# Patient Record
Sex: Female | Born: 1980 | Race: Black or African American | Hispanic: No | Marital: Married | Smoking: Current every day smoker
Health system: Southern US, Community
[De-identification: ages and names within clinical notes are randomized; demographics above are authoritative.]

---

## 2003-07-02 ENCOUNTER — Encounter (INDEPENDENT_AMBULATORY_CARE_PROVIDER_SITE_OTHER): Payer: Self-pay | Admitting: Specialist

## 2003-07-02 ENCOUNTER — Inpatient Hospital Stay (HOSPITAL_COMMUNITY): Admission: AD | Admit: 2003-07-02 | Discharge: 2003-08-01 | Payer: Self-pay | Admitting: Obstetrics & Gynecology

## 2003-09-03 ENCOUNTER — Ambulatory Visit (HOSPITAL_COMMUNITY): Admission: RE | Admit: 2003-09-03 | Discharge: 2003-09-03 | Payer: Self-pay | Admitting: Obstetrics and Gynecology

## 2003-10-03 ENCOUNTER — Encounter (INDEPENDENT_AMBULATORY_CARE_PROVIDER_SITE_OTHER): Payer: Self-pay | Admitting: Specialist

## 2003-10-03 ENCOUNTER — Inpatient Hospital Stay (HOSPITAL_COMMUNITY): Admission: AD | Admit: 2003-10-03 | Discharge: 2003-10-06 | Payer: Self-pay | Admitting: Obstetrics

## 2004-04-26 ENCOUNTER — Ambulatory Visit (HOSPITAL_COMMUNITY): Admission: RE | Admit: 2004-04-26 | Discharge: 2004-04-26 | Payer: Self-pay | Admitting: Obstetrics & Gynecology

## 2004-04-27 ENCOUNTER — Emergency Department (HOSPITAL_COMMUNITY): Admission: EM | Admit: 2004-04-27 | Discharge: 2004-04-27 | Payer: Self-pay | Admitting: *Deleted

## 2004-04-28 ENCOUNTER — Emergency Department (HOSPITAL_COMMUNITY): Admission: EM | Admit: 2004-04-28 | Discharge: 2004-04-28 | Payer: Self-pay | Admitting: Emergency Medicine

## 2004-06-07 ENCOUNTER — Ambulatory Visit (HOSPITAL_COMMUNITY): Admission: RE | Admit: 2004-06-07 | Discharge: 2004-06-07 | Payer: Self-pay | Admitting: Obstetrics

## 2004-07-25 ENCOUNTER — Inpatient Hospital Stay (HOSPITAL_COMMUNITY): Admission: AD | Admit: 2004-07-25 | Discharge: 2004-07-30 | Payer: Self-pay | Admitting: Obstetrics

## 2004-08-12 ENCOUNTER — Ambulatory Visit (HOSPITAL_COMMUNITY): Admission: RE | Admit: 2004-08-12 | Discharge: 2004-08-12 | Payer: Self-pay | Admitting: Obstetrics & Gynecology

## 2004-09-02 ENCOUNTER — Ambulatory Visit (HOSPITAL_COMMUNITY): Admission: RE | Admit: 2004-09-02 | Discharge: 2004-09-02 | Payer: Self-pay | Admitting: Obstetrics & Gynecology

## 2004-09-03 ENCOUNTER — Observation Stay (HOSPITAL_COMMUNITY): Admission: AD | Admit: 2004-09-03 | Discharge: 2004-09-04 | Payer: Self-pay | Admitting: Obstetrics & Gynecology

## 2004-09-13 ENCOUNTER — Inpatient Hospital Stay (HOSPITAL_COMMUNITY): Admission: AD | Admit: 2004-09-13 | Discharge: 2004-09-19 | Payer: Self-pay | Admitting: Obstetrics

## 2004-09-16 ENCOUNTER — Encounter (INDEPENDENT_AMBULATORY_CARE_PROVIDER_SITE_OTHER): Payer: Self-pay | Admitting: Specialist

## 2004-11-12 ENCOUNTER — Emergency Department (HOSPITAL_COMMUNITY): Admission: EM | Admit: 2004-11-12 | Discharge: 2004-11-12 | Payer: Self-pay | Admitting: Emergency Medicine

## 2005-03-13 ENCOUNTER — Emergency Department (HOSPITAL_COMMUNITY): Admission: EM | Admit: 2005-03-13 | Discharge: 2005-03-14 | Payer: Self-pay | Admitting: Emergency Medicine

## 2006-01-16 ENCOUNTER — Emergency Department (HOSPITAL_COMMUNITY): Admission: EM | Admit: 2006-01-16 | Discharge: 2006-01-16 | Payer: Self-pay | Admitting: Emergency Medicine

## 2006-05-03 ENCOUNTER — Other Ambulatory Visit (HOSPITAL_COMMUNITY): Admission: RE | Admit: 2006-05-03 | Discharge: 2006-08-01 | Payer: Self-pay | Admitting: Psychiatry

## 2006-05-05 ENCOUNTER — Ambulatory Visit: Payer: Self-pay | Admitting: Psychiatry

## 2007-02-16 IMAGING — US US OB COMP +14 WK
1 series · 13 of 28 positions shown · non-contrast
Comparison: none

<!--  IDXRADR:ADDEND:BEGIN -->Addendum Begins<!--  IDXRADR:ADDEND:INNER_BEGIN -->Retrospective review of this exam suggests the presence of an asymmetric complete placenta previa.

 <!--  IDXRADR:ADDEND:INNER_END -->Addendum Ends
<!--  IDXRADR:ADDEND:END -->Clinical Data:  G4 P2 SAB1.  LMP 02/05/04.  Assess fetal anatomy.
 OBSTETRICAL ULTRASOUND:
 Number of Fetuses:  1
 Heart Rate:  160
 Movement:  Yes
 Breathing:  No  
 Presentation:  Transverse with head on maternal right
 Placental Location:  Anterior, right lateral 
 Grade:  I
 Previa:  No
 Amniotic Fluid (Subjective):  Normal
 Amniotic Fluid (Objective):   3.4 cm Vertical pocket 
 FETAL BIOMETRY
 BPD:   4.0 cm   18 w 1 d
 HC:   15.4 cm   18 w 3 d
 AC:   14.0 cm   19 w 3 d
 FL:    3.0 cm   19 w 1 d
 MEAN GA:  18 w 6 d
 1st US GA:  19 w 0 d (Assigned)    
 FETAL ANATOMY
 Lateral Ventricles:    Visualized 
 Thalami/CSP:      Visualized 
 Posterior Fossa:  Visualized 
 Nuchal Region:    Visualized 
 Spine:      Visualized 
 4 Chamber Heart on Left:      Visualized 
 Stomach on Left:      Visualized 
 3 Vessel Cord:    Visualized 
 Cord Insertion site:    Visualized 
 Kidneys:  Visualized 
 Bladder:  Visualized 
 Extremities:      Visualized 
 ADDITIONAL ANATOMY VISUALIZED:  LVOT, RVOT, upper lip, orbits, profile, diaphragm, 5th digit, ductal arch, aortic arch, and female genitalia.
 MATERNAL UTERINE AND ADNEXAL FINDINGS
 Cervix:   4.1 cm Transabdominally

[Series 1: us ob comp +14 wk · 0.22mm/px · 13 of 76 slices shown]
[im 3/76]
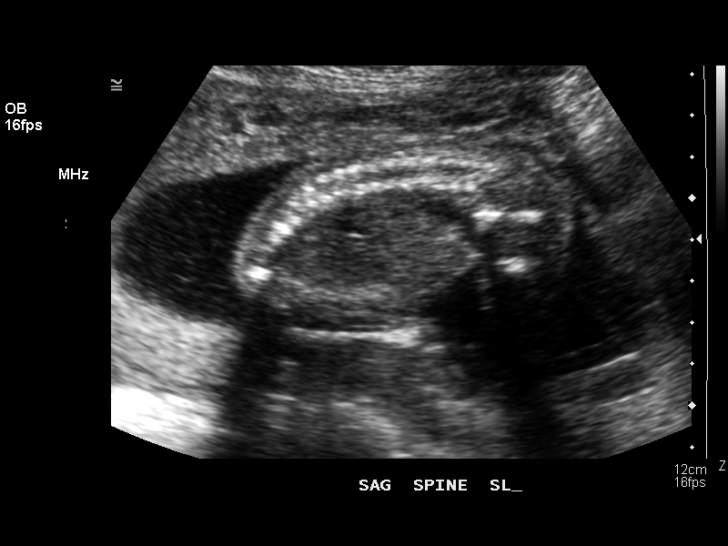
[im 9/76]
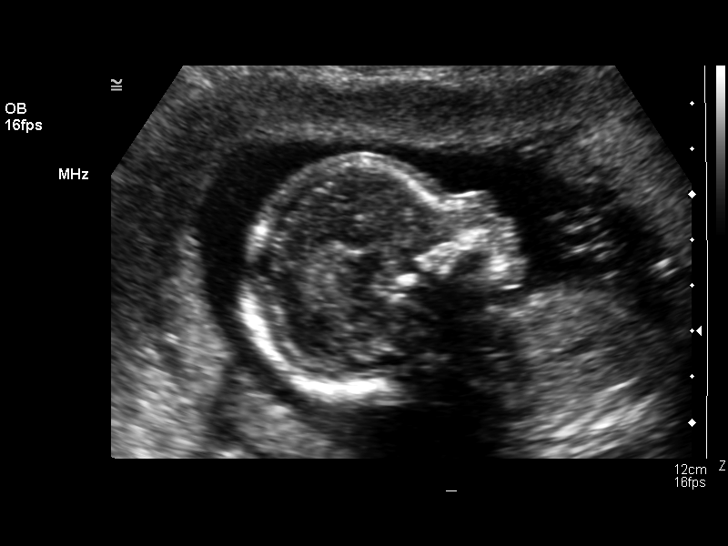
[im 14/76]
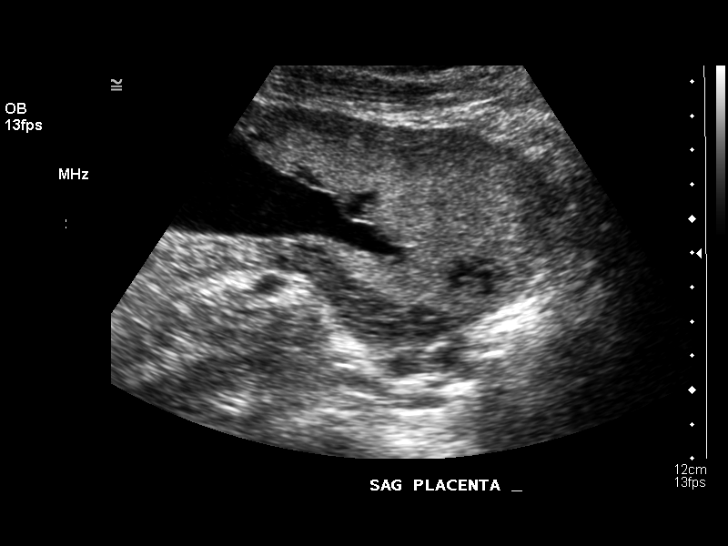
[im 20/76]
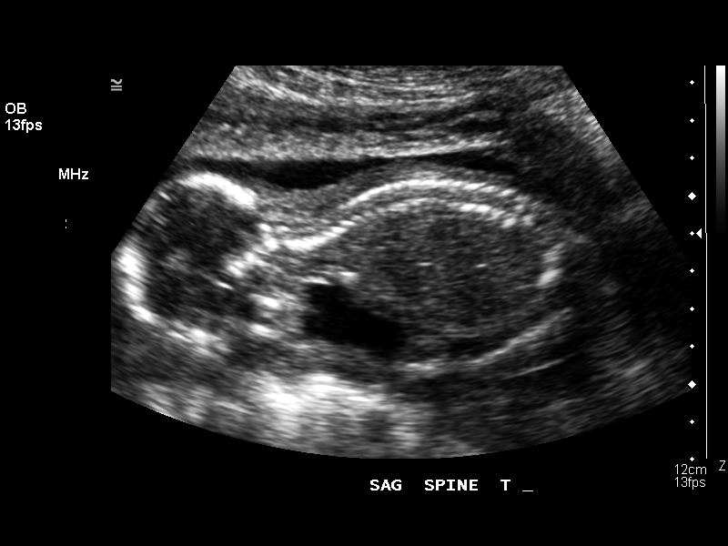
[im 26/76]
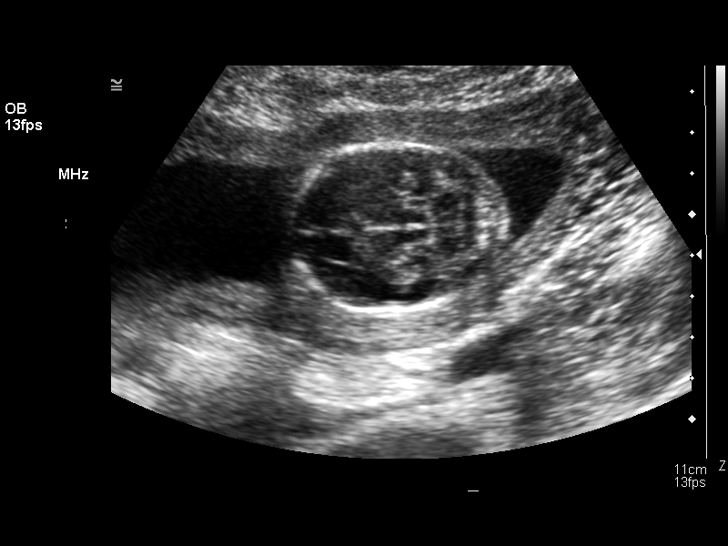
[im 31/76]
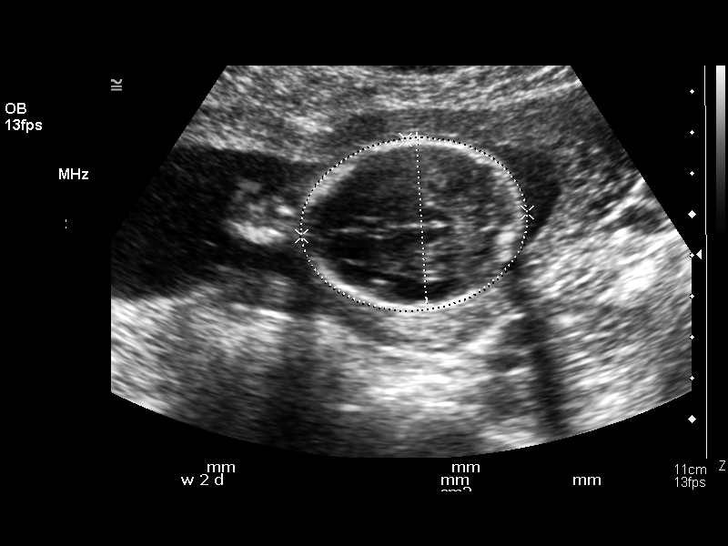
[im 39/76]
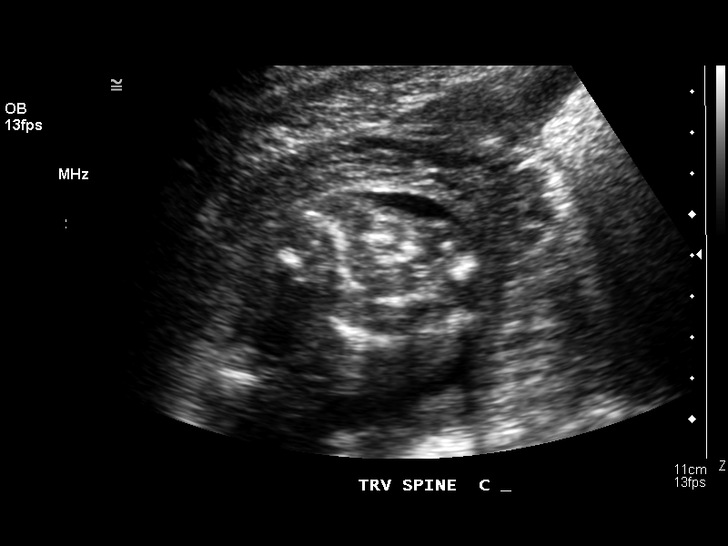
[im 45/76]
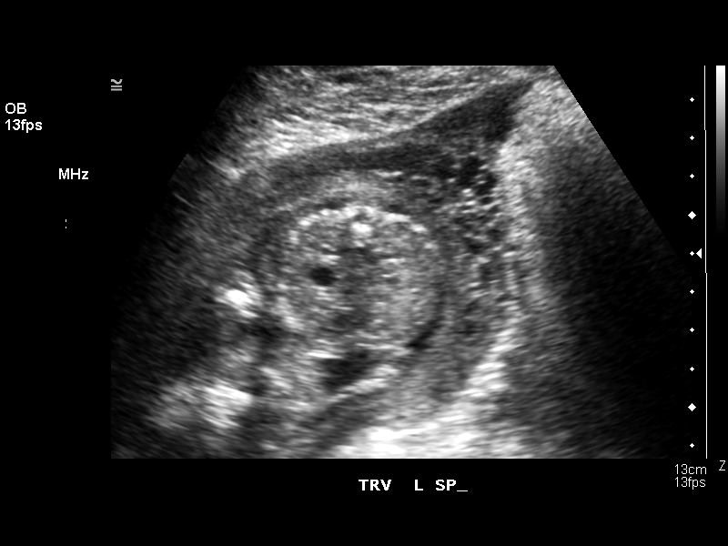
[im 51/76]
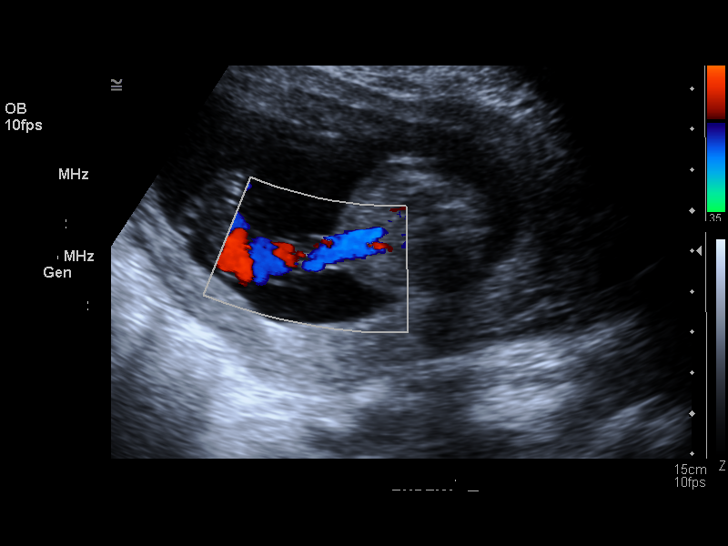
[im 56/76]
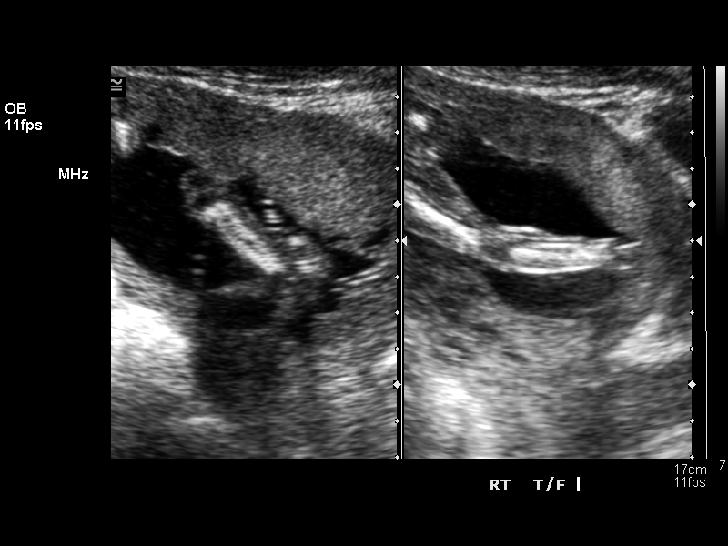
[im 62/76]
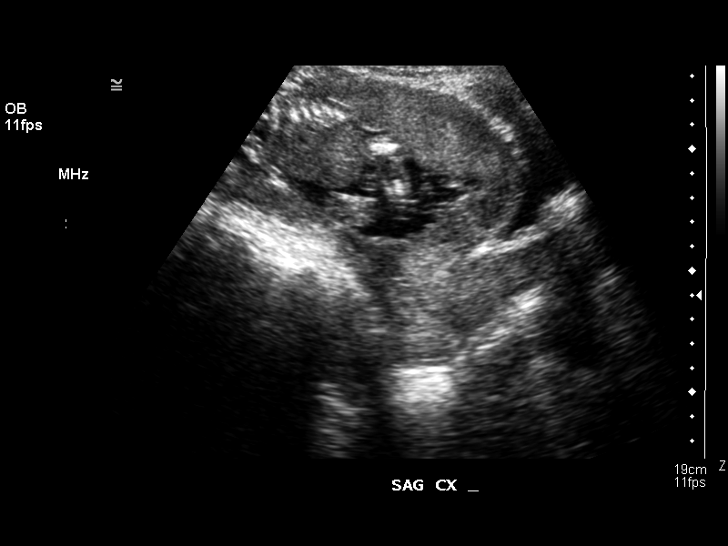
[im 67/76]
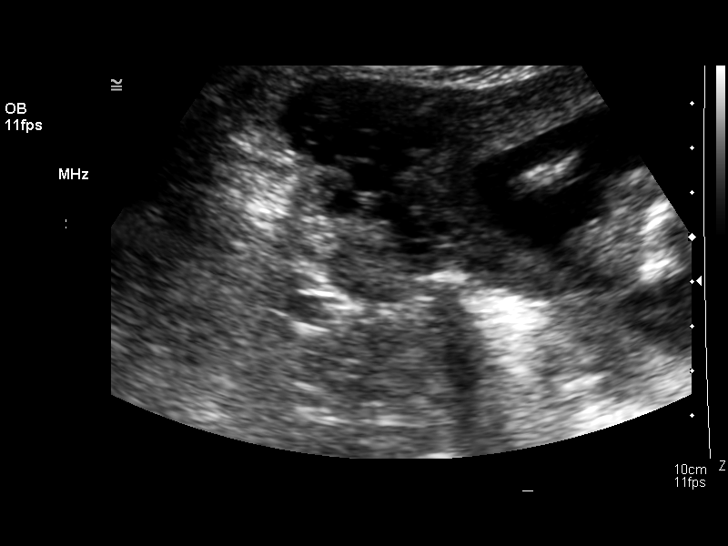
[im 73/76]
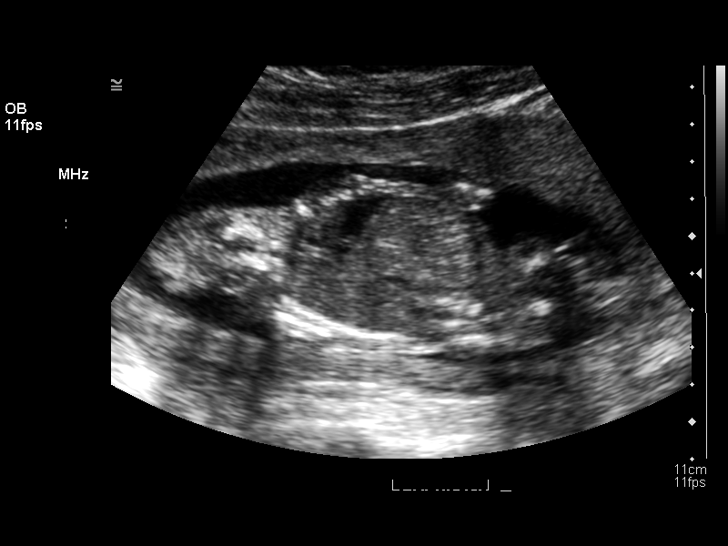

[13 of 28 positions shown; findings below may reference images not displayed]

IMPRESSION: 1.  Single living intrauterine fetus in transverse lie with head on maternal right.  Patient is 19 weeks by first ultrasound and measures 18 weeks 6 days today indicating appropriate growth.  
 2.  Heel not visualized.  Otherwise, no anatomic abnormality noted.  

 </u12:p>

## 2007-03-09 ENCOUNTER — Emergency Department (HOSPITAL_COMMUNITY): Admission: EM | Admit: 2007-03-09 | Discharge: 2007-03-09 | Payer: Self-pay | Admitting: Family Medicine

## 2008-07-03 ENCOUNTER — Emergency Department (HOSPITAL_COMMUNITY): Admission: EM | Admit: 2008-07-03 | Discharge: 2008-07-03 | Payer: Self-pay | Admitting: Family Medicine

## 2010-05-09 ENCOUNTER — Encounter: Payer: Self-pay | Admitting: Obstetrics & Gynecology

## 2010-09-03 NOTE — Discharge Summary (Signed)
NAME:  Cynthia Potter, Cynthia Potter                         ACCOUNT NO.:  [id number]   MEDICAL RECORD NO.:  [id number]                   PATIENT TYPE:  INP   LOCATION:  9127                                 FACILITY:  [facility]   PHYSICIAN:  Roseanna Rainbow, M.D.         DATE OF BIRTH:  1980-10-29   DATE OF ADMISSION:  07/02/2003  DATE OF DISCHARGE:  08/01/2003                                 DISCHARGE SUMMARY   CHIEF COMPLAINT:  The patient is a 30 year old gravida 3, para 0-1-1-1 who  presents at 22 weeks with an ultrasound that demonstrates a dynamic cervix.   HISTORY OF PRESENT ILLNESS:  As above.  The patient also reported cramping.   PRENATAL COURSE:  Source of care, [facility], with onset of care 22 weeks.   PREGNANCY COMPLICATIONS OR RISKS:  1. Late prenatal care.  2. History of twin gestation spontaneous AB second trimester.  3. History of a preterm delivery at 29 weeks, delivered by cesarean section.   MEDICATIONS:  Prenatal vitamins.   ALLERGIES:  No known drug allergies.   PAST OB/GYN HISTORY:  See above.   LABORATORY DATA:  Ultrasound on the day of presentation demonstrated a  cervix that had a length of 4.4 that shortened to 3.2 cm with funneling and  a low lying placenta.   PHYSICAL EXAMINATION:  VITAL SIGNS:  Stable.  Afebrile.  SPECULUM EXAMINATION:  The cervix appears close.  Pap smear, wet prep,  gonorrhea, Chlamydia and GBS were sent.  Fetal heart tones were 150.  Tocodynamometer did not demonstrate any uterine contractions.   ASSESSMENT:  Intrauterine pregnancy at 23 weeks with threatened preterm  labor. Poor obstetrical history.   PLAN:  Admission.  Tocolysis.  Bed rest and parenteral antibiotics.   HOSPITAL COURSE:  The patient was subsequently admitted and was started on  magnesium sulfate for tocolysis and clindamycin parenterally.  She reported  decreased cramping on hospital day #1.  The cramping subsequently resolved.  The clindamycin was discontinued after one  week of therapy.   On July 13, 2003, there was a question of whether or not the patient had a  desaturation in her oxygen saturation  at 70 to 80.  The patient reported  some right epigastric discomfort with her respiratory effort for two days  that was worse.  On July 13, 2003,  she reported a cough.  Subsequent O2  saturations were 95 to 99% on room air.  Lung examination was unremarkable.  At this point, labs were obtained.  An arterial blood gas, chest x-ray and  EKG.  The magnesium sulfate was discontinued.  A phone consult was obtained  with pulmonary and a spiral chest CT was advised and this subsequently was  normal.  Chest x-ray demonstrated atelectasis and EKG was normal sinus  rhythm.  She failed to have any further episodes of respiratory distress.   On the day of discharge, she was complaining of vaginal discharge and  wet  prep was consistent with yeast vulvovaginitis and she was given Diflucan.  She was also complaining of painful area on the left vulva that was  consistent with possible folliculitis, however, she reported that her  partner has a history of genital  herpes and she has a history of similar  lesions not previously diagnosed and HSV titers were sent.   The ultrasound on the day of discharge again demonstrated the estimated  fetal weight growth percentile of 50 to 75th percentile.  Normal amniotic  fluid index.  No low lying placenta and the cervix was 3.4 cm long and  dynamic.   DISCHARGE DIAGNOSES:  1. Intrauterine pregnancy at 27+ weeks.  2. Threatened preterm labor.   CONDITION ON DISCHARGE:  Stable.   DIET:  Regular.   ACTIVITY:  Bed rest, pelvic rest.   MEDICATIONS:  1. Diflucan.  2. Prenatal vitamins.   DISPOSITION:  The patient was to follow up in the office in one week.                                               Roseanna Rainbow, M.D.    Judee Clara  D:  08/17/2003  T:  08/18/2003  Job:  [id number]

## 2010-09-03 NOTE — Discharge Summary (Signed)
Cynthia Potter, Cynthia Potter               ACCOUNT NO.:  [id number]   MEDICAL RECORD NO.:  [id number]          PATIENT TYPE:  INP   LOCATION:  9157                          FACILITY:  [facility]   PHYSICIAN:  Roseanna Rainbow, M.D.DATE OF BIRTH:  1981-03-19   DATE OF ADMISSION:  07/25/2004  DATE OF DISCHARGE:  07/30/2004                                 DISCHARGE SUMMARY   CHIEF COMPLAINT:  The patient is a 30 year old gravida 4, para 0-2-0-2, with  an estimated date of confinement of July 17, who presents with vaginal  bleeding after intercourse.   HISTORY OF PRESENT ILLNESS:  Please see the above.   ANTEPARTUM COURSE/PROBLEMS/RISKS:  She has a history of two previous  cesarean deliveries with a T uterine incision complicating the first  cesarean delivery.  History of preterm delivery.   PAST SURGICAL HISTORY:  Please see the above.   PAST MEDICAL HISTORY:  She denies.   MEDICATIONS:  Prenatal vitamins.   ALLERGIES:  No known drug allergies.   SOCIAL HISTORY:  She is married.  She denies any tobacco, ethanol, or  substance abuse.   PHYSICAL EXAMINATION:  VITAL SIGNS:  Stable, afebrile.  ABDOMEN:  Gravid and nontender.  PELVIC:  Vagina without lacerations.  There was a blood clot noted in the  vault.  Cervix without active bleeding.   ASSESSMENT:  Marginal placental separation, stable.   LABORATORY DATA:  Ultrasound that demonstrated a complete placenta previa  with a normal amniotic fluid index.   PLAN:  Admission, bed rest.   HOSPITAL COURSE:  The patient denied any further episodes of bleeding or  cramping.  She was discharged to home on July 30, 2004.   DISCHARGE DIAGNOSES:  1.  Complete placenta previa.  2.  Intrauterine pregnancy at 26+ weeks.   CONDITION ON DISCHARGE:  Stable.   DIET:  Regular.   ACTIVITY:  Pelvic rest, modified bed rest.   DISCHARGE MEDICATIONS:  Resume home medications.   DISPOSITION:  The patient is to follow up in the office in 1  week.      LAJ/MEDQ  D:  08/29/2004  T:  08/30/2004  Job:  [id number]

## 2010-09-03 NOTE — Op Note (Signed)
NAME:  Cynthia Potter, Cynthia Potter                         ACCOUNT NO.:  [id number]   MEDICAL RECORD NO.:  [id number]                   PATIENT TYPE:  INP   LOCATION:  9130                                 FACILITY:  [facility]   PHYSICIAN:  Charles A. Clearance Coots, M.D.             DATE OF BIRTH:  17-Nov-1980   DATE OF PROCEDURE:  10/03/2003  DATE OF DISCHARGE:                                 OPERATIVE REPORT   PREOPERATIVE DIAGNOSES:  1. Thirty-six weeks' gestation.  2. Previous classical cesarean section.  3. Premature rupture of membranes, not in labor.   POSTOPERATIVE DIAGNOSES:  1. Thirty-six weeks' gestation.  2. Previous classical cesarean section.  3. Premature rupture of membranes, not in labor.   PROCEDURE:  Repeat low transverse cesarean section.   SURGEON:  Charles A. Clearance Coots, M.D.   ASSISTANT:  Charlsie Merles, O.R. Tech.   ANESTHESIA:  Spinal.   ESTIMATED BLOOD LOSS:  800 mL.   FLUIDS REPLACED:  1700 mL.   URINE OUTPUT:  300 mL clear.   COMPLICATIONS:  None.   Foley to gravity.   FINDINGS:  A viable female at 1522, Apgars of 8 at one minute, 7 at five  minutes, 8 at 10 minutes.  Weight of 5 pounds 13 ounces.  Normal uterus,  ovaries, and fallopian tubes.   OPERATION:  The patient was brought to the operating room and after  satisfactory spinal anesthesia, the abdomen was prepped and draped in the  usual sterile fashion.  A Pfannenstiel skin incision was made through the  previous scar down to the fascia with a scalpel.  The fascia was nicked in  the midline and the fascial incision was extended to the left and to the  right with curved Mayo scissors.  The superior and inferior fascial edges  were taken off of the rectus muscle with both blunt and sharp dissection.  The rectus muscle was bluntly and sharply divided in the midline.  The  peritoneum was entered digitally and was digitally extended to the left and  to the right.  The bladder blade was positioned.  The vesicouterine  fold of  peritoneum above the reflection of the urinary bladder was grasped with  forceps and was incised and undermined with Metzenbaum scissors.  The  incision was extended to the left and to the right with the Metzenbaum  scissors.  The bladder flap was bluntly developed and the bladder blade was  repositioned in front of the urinary bladder, placing it well out of the  operative field.  The uterus was then entered transversely in the lower  uterine segment with a scalpel.  The uterine incision was extended to the  left and to the right with bandage scissors.  The vertex was then delivered  with the aid of fundal pressure from the assistant.  Infant's mouth and nose  were suctioned with a suction bulb and the delivery was completed with the  aid of fundal pressure from the assistant.  The infant's umbilical cord was  doubly clamped and cut and the infant was handed off to the nursery staff.  Cord blood was obtained, and the placenta was spontaneously expelled from  the uterine cavity intact.  The endometrial surface was thoroughly debrided  with a dry lap sponge.  Edges of the uterine incision were grasped with the  ring forceps and the uterus was closed with a continuous interlocking suture  of a single layer of 0 Monocryl.  Hemostasis was excellent.  The pelvic  cavity was thoroughly irrigated with warm saline solution and all clots were  removed.  The uterus was again observed for hemostasis, and there was no  active bleeding noted.  The abdomen was then closed as follows:  The  peritoneum was closed with a continuous suture of 3-0 Monocryl, fascia was  closed with a continuous suture of 0 PDS, subcutaneous tissue was thoroughly  irrigated with warm saline solution, and all areas of subcutaneous bleeding  were coagulated with the Bovie.  Skin was then closed with a continuous  subcuticular suture of 3-0 Monocryl.  Sterile bandages applied to the  incision closure.  Surgical technician  indicated that all needle, sponge,  and instrument counts were correct x2.  The patient tolerated the procedure  well and was transported to the recovery room in satisfactory condition.                                               Charles A. Clearance Coots, M.D.    CAH/MEDQ  D:  10/03/2003  T:  10/06/2003  Job:  [id number]

## 2010-09-03 NOTE — Op Note (Signed)
Cynthia Potter, Cynthia Potter               ACCOUNT NO.:  [id number]   MEDICAL RECORD NO.:  [id number]          PATIENT TYPE:  INP   LOCATION:  9138                          FACILITY:  [facility]   PHYSICIAN:  Roseanna Rainbow, M.D.DATE OF BIRTH:  February 21, 1981   DATE OF PROCEDURE:  09/16/2004  DATE OF DISCHARGE:                                 OPERATIVE REPORT   PREOPERATIVE DIAGNOSES:  1.  Intrauterine pregnancy at 33+ weeks.  2.  Complete placenta previa.  3.  Threatened preterm labor.  4.  Multiparity, desires a sterilization procedure.  5.  History of two previous cesarean deliveries.   POSTOPERATIVE DIAGNOSES:  1.  Intrauterine pregnancy at 33+ weeks.  2.  Complete placenta previa.  3.  Threatened preterm labor.  4.  Multiparity, desires a sterilization procedure.  5.  History of two previous cesarean deliveries.   PROCEDURES:  Repeat low very low transverse cesarean section, modified  Pomeroy bilateral tubal ligation and scar revision via Pfannenstiel.   SURGEONS:  Roseanna Rainbow, M.D., Bing Neighbors. Clearance Coots, M.D.   ANESTHESIA:  Spinal.   COMPLICATIONS:  None.   ESTIMATED BLOOD LOSS:  600 mL.   FLUIDS AND URINE OUTPUT:  As per anesthesiology.   INDICATIONS:  The patient is a 30 year old para 2 at 33+ weeks with a  placenta previa, hospitalized for a bleeding episode, now with regular  uterine contractions.   FINDINGS:  A female infant in cephalic presentation.  Neonatology present at  delivery.  Apgars 5 and 8.   PROCEDURE:  The patient was taken to the operating room, where a spinal  anesthetic was administered and found to be adequate.  She was then prepped  and draped in the normal sterile fashion in the dorsal supine position with  a leftward tilt.  The previous scar was then excised and the incision was  then carried down to the underlying fascia with the Bovie.  The fascia was  then incised in the midline.  This incision was then extended laterally with  curved  Mayo scissors.  The superior aspect of the fascial incision was then  grasped with Kocher clamps, elevated and the underlying rectus muscles  dissected off.  Attention was then turned to the inferior aspect of this  incision, which was then manipulated in a similar fashion.  The rectus  muscles were then separated in the midline.  The parietal peritoneum was  identified and tented up and entered sharply.  The peritoneal incision was  then extended superiorly and inferiorly with good visualization of the  bladder.  The bladder blade was then inserted and the vesicouterine  peritoneum was identified, grasped with pickups and entered sharply with the  Metzenbaum scissors.  This incision was then extended laterally and a bladder flap created  sharply.  The bladder blade was then reinserted in the lower uterine segment  was incised in a transverse fashion with a scalpel.  The uterine incision  was then extended bilaterally bluntly.  The bladder blade was removed and  the infant's head delivered atraumatically.  The nose and mouth were  suctioned with bulb  suction and the cord was clamped and cut.  The infant  was handed off to the waiting neonatologist.  The placenta was then removed  manually.  The uterus exteriorized and cleared of all clots and debris.  The  uterine incision was then reapproximated with 0 Monocryl in a running locked  fashion.  Adequate hemostasis was noted.  The midisthmic portion of the left fallopian tube was then grasped with a  Babcock clamp.  The segment of tube was doubly ligated with two ligatures of  0 plain.  The segment of tube was then excised.  Adequate hemostasis was noted.  The right fallopian tube was manipulated in  a similar fashion.  The uterus was returned to the abdomen.  The gutters were cleared of all  clots.  The parietal peritoneum was closed with 2-0 Vicryl.  The fascia was  reapproximated with 0 PDS in a running fashion.  The skin was closed in a   subcuticular fashion with 3-0 Monocryl.  The patient tolerated the procedure  well.  Sponge, lap and needle counts were correct x2.  Cefazolin 1 g was given at  cord clamp.  The patient was taken to the PACU in stable condition.      LAJ/MEDQ  D:  09/16/2004  T:  09/17/2004  Job:  [id number]

## 2010-09-03 NOTE — Discharge Summary (Signed)
NAME:  Cynthia Potter, Cynthia Potter                         ACCOUNT NO.:  [id number]   MEDICAL RECORD NO.:  [id number]                   PATIENT TYPE:  INP   LOCATION:  9130                                 [facility]ACILITY[facility]   PHYSICIAN:  Charles A. Clearance Coots, M.D.             DATE OF BIRTH:  1980/12/20   DATE OF ADMISSION:  10/03/2003  DATE OF DISCHARGE:  10/06/2003                                 DISCHARGE SUMMARY   ADMITTING DIAGNOSES:  1. Premature rupture of membranes at [redacted] weeks gestation.  2. Previous classical cesarean section for repeat cesarean section.   DISCHARGE DIAGNOSES:  1. Premature rupture of membranes at [redacted] weeks gestation.  2. Previous classical cesarean section for repeat cesarean section.  3. Status post repeat low transverse cesarean section on October 03, 2003.     Viable female delivered at 1522 with Apgars of 8 at one minute, 7 at five     minutes, 8 at 10 minutes.  Weight of 5 pounds 13 ounces.  Mother and     infant discharged home in good condition.   REASON FOR ADMISSION:  A 30 year old black female G3 P0-1-1-1 presented at  46-[redacted] weeks gestation with complaint of leaking fluid.  On examination was  found to have spontaneous rupture of membranes.  The patient had a previous  classical cesarean section and was to have a repeat cesarean section.  She  was therefore taken for repeat cesarean section.   PAST MEDICAL HISTORY:  Surgery:  Cesarean section.  Illnesses:  Anemia.   MEDICATIONS:  Prenatal vitamins.   ALLERGIES:  No known drug allergies.   SOCIAL HISTORY:  Married.  Negative tobacco, alcohol, or recreational drug  use.   PHYSICAL EXAMINATION:  GENERAL:  Well-nourished, well-developed black female  in no acute distress.  VITAL SIGNS:  She is afebrile, vital signs are stable.  LUNGS:  Clear to auscultation bilaterally.  HEART:  Regular rate and rhythm.  ABDOMEN:  Gravid, soft, nontender.  PELVIC:  Cervical exam was omitted.   Ultrasound revealed cervical length  of approximately 3 cm.  External fetal  monitoring revealed reactive tracing and occasional uterine contractions.   ADMITTING LABORATORY VALUES:  Hemoglobin 11.4; hematocrit 34.5; white blood  cell count 9200; platelets 133,000.  RPR was nonreactive.   HOSPITAL COURSE:  The patient underwent a repeat low transverse cesarean  section on October 03, 2003.  There were no intraoperative complications.  Postoperative course was uncomplicated and the patient was discharged home  on postoperative day #3 in good condition.   DISCHARGE LABORATORY VALUES:  Hemoglobin 9.8; hematocrit 29.5; white blood  cell count 9400; platelets 128,000.   DISCHARGE DISPOSITION:  1. Medications:  Tylox and ibuprofen for pain.  Continue prenatal vitamins.  2. Routine written instructions per booklet were given for discharge.  3. The patient is to follow up in the office in 6 weeks.  Charles A. Clearance Coots, M.D.    CAH/MEDQ  D:  10/06/2003  T:  10/07/2003  Job:  [id number]

## 2010-09-03 NOTE — Discharge Summary (Signed)
Cynthia Potter, Cynthia Potter               ACCOUNT NO.:  [id number]   MEDICAL RECORD NO.:  [id number]          PATIENT TYPE:  INP   LOCATION:  9138                          [facility]ACILITY[facility]   PHYSICIAN:  Roseanna Rainbow, M.D.DATE OF BIRTH:  1981/03/27   DATE OF ADMISSION:  09/13/2004  DATE OF DISCHARGE:  09/19/2004                                 DISCHARGE SUMMARY   CHIEF COMPLAINT:  The patient is 30 year old, gravida 4, para 1-2-1-2,  African American female with an estimated date of confinement of November 01, 2004 who presents with vaginal bleeding.   HISTORY OF PRESENT ILLNESS:  The patient has a history of a placenta previa.  She has previously been admitted with episodes of vaginal bleeding.  She  reports onset of vaginal bleeding approximately 20 minutes prior to her  present presentation.   PAST OB/GYN HISTORY:  1.  She has had a previous cesarean delivery complicated by a T-shaped      uterine incision extension.  2.  She has a history of genital herpes.   PAST SURGICAL HISTORY:  Please see the above.   PAST MEDICAL HISTORY:  She denies.   MEDICATIONS:  1.  Prenatal vitamins.  2.  Mylanta.   ALLERGIES:  No known drug allergies.   SOCIAL HISTORY:  She denies any tobacco, ethanol or drug use.   PHYSICAL EXAMINATION:  VITAL SIGNS: Temperature 98.9, pulse 86, respiratory  rate 18, blood pressure 122/55.  ABDOMEN:  Gravid, nontender.  PELVIC:  Deferred.   External fetal monitoring reassuring.  No uterine contractions.   ASSESSMENT:  Intrauterine pregnancy at 33 weeks with placenta previa with  vaginal bleeding, stable at present.   PLAN:  Admission and bed rest.   HOSPITAL COURSE:  The bleeding was self-limited and on hospital day #1 she  no longer was complaining of any further bleeding.  She had been started on  magnesium sulfate for tocolysis.  However,  on hospital day #2, she had  another episode of vaginal bleeding.  On hospital day #3, she complained of  increasing uterine contractions and at this point she was brought for repeat  cesarean delivery.  Please see the dictated operative summary for further  details.   In the immediate postoperative period, she had increased vaginal bleeding  which was felt to be secondary to uterine atony of the lower uterine segment  and she remained stable.  The bleeding responded to uterotonics.  Her  hemoglobin on postoperative day #1 was 5.8.  However, she denied any  symptoms consistent with the severe anemia.  She remained asymptomatic and  was discharged to home on postoperative day #3.   DISCHARGE DIAGNOSES:  Intrauterine pregnancy at 33+ weeks with a placenta  previa, abruptio placentae.   PROCEDURE:  Repeat cesarean delivery.   CONDITION ON DISCHARGE:  Stable.   DIET:  Regular.   ACTIVITY:  Modified bed rest, pelvic rest.   DISCHARGE MEDICATIONS:  Percocet, prenatal vitamins and ferrous sulfate.   DISPOSITION:  The patient was to follow up in the office in several days.  LAJ/MEDQ  D:  10/07/2004  T:  10/08/2004  Job:  [id number]

## 2011-01-25 LAB — POCT URINALYSIS DIP (DEVICE)
Bilirubin Urine: NEGATIVE
Glucose, UA: NEGATIVE
Ketones, ur: NEGATIVE
Nitrite: NEGATIVE
Operator id: 247071
Protein, ur: NEGATIVE
Specific Gravity, Urine: 1.015
Urobilinogen, UA: 1
pH: 8.5 — ABNORMAL HIGH

## 2011-01-25 LAB — POCT PREGNANCY, URINE
Operator id: 247071
Preg Test, Ur: NEGATIVE

## 2012-01-21 ENCOUNTER — Emergency Department (HOSPITAL_BASED_OUTPATIENT_CLINIC_OR_DEPARTMENT_OTHER)
Admission: EM | Admit: 2012-01-21 | Discharge: 2012-01-21 | Disposition: A | Discharge status: Home / Self Care | Payer: Self-pay | Attending: Emergency Medicine | Admitting: Emergency Medicine

## 2012-01-21 ENCOUNTER — Emergency Department (HOSPITAL_BASED_OUTPATIENT_CLINIC_OR_DEPARTMENT_OTHER): Payer: Self-pay

## 2012-01-21 ENCOUNTER — Encounter (HOSPITAL_BASED_OUTPATIENT_CLINIC_OR_DEPARTMENT_OTHER): Payer: Self-pay | Admitting: *Deleted

## 2012-01-21 DIAGNOSIS — R10819 Abdominal tenderness, unspecified site: Secondary | ICD-10-CM | POA: Insufficient documentation

## 2012-01-21 DIAGNOSIS — R112 Nausea with vomiting, unspecified: Secondary | ICD-10-CM | POA: Insufficient documentation

## 2012-01-21 DIAGNOSIS — F17200 Nicotine dependence, unspecified, uncomplicated: Secondary | ICD-10-CM | POA: Insufficient documentation

## 2012-01-21 DIAGNOSIS — R109 Unspecified abdominal pain: Secondary | ICD-10-CM | POA: Insufficient documentation

## 2012-01-21 DIAGNOSIS — Z9889 Other specified postprocedural states: Secondary | ICD-10-CM | POA: Insufficient documentation

## 2012-01-21 LAB — CBC WITH DIFFERENTIAL/PLATELET
Basophils Relative: 0 % (ref 0–1)
HCT: 38.2 % (ref 36.0–46.0)
Hemoglobin: 13.4 g/dL (ref 12.0–15.0)
Lymphs Abs: 2 10*3/uL (ref 0.7–4.0)
MCH: 31.3 pg (ref 26.0–34.0)
MCHC: 35.1 g/dL (ref 30.0–36.0)
Monocytes Absolute: 0.5 10*3/uL (ref 0.1–1.0)
Monocytes Relative: 6 % (ref 3–12)
Neutro Abs: 5.4 10*3/uL (ref 1.7–7.7)
Neutrophils Relative %: 67 % (ref 43–77)
RBC: 4.28 MIL/uL (ref 3.87–5.11)

## 2012-01-21 LAB — COMPREHENSIVE METABOLIC PANEL
Alkaline Phosphatase: 66 U/L (ref 39–117)
BUN: 11 mg/dL (ref 6–23)
CO2: 27 mEq/L (ref 19–32)
Chloride: 105 mEq/L (ref 96–112)
Creatinine, Ser: 0.7 mg/dL (ref 0.50–1.10)
GFR calc Af Amer: >90 mL/min (ref >90)
GFR calc non Af Amer: >90 mL/min (ref >90)
Glucose, Bld: 84 mg/dL (ref 70–99)
Potassium: 3.9 mEq/L (ref 3.5–5.1)
Total Bilirubin: 0.3 mg/dL (ref 0.3–1.2)

## 2012-01-21 LAB — URINALYSIS, ROUTINE W REFLEX MICROSCOPIC
Glucose, UA: NEGATIVE mg/dL
Hgb urine dipstick: NEGATIVE
Ketones, ur: 15 mg/dL — AB
Leukocytes, UA: NEGATIVE
Protein, ur: NEGATIVE mg/dL
pH: 6 (ref 5.0–8.0)

## 2012-01-21 LAB — LIPASE, BLOOD: Lipase: 31 U/L (ref 11–59)

## 2012-01-21 LAB — PREGNANCY, URINE: Preg Test, Ur: NEGATIVE

## 2012-01-21 MED ORDER — SODIUM CHLORIDE 0.9 % IV SOLN
Freq: Once | INTRAVENOUS | Status: AC
  Administered 2012-01-21: 150 mL/h via INTRAVENOUS

## 2012-01-21 MED ORDER — IOHEXOL 300 MG/ML  SOLN
20.0000 mL | Freq: Once | INTRAMUSCULAR | Status: AC | PRN
  Administered 2012-01-21: 20 mL via INTRAVENOUS

## 2012-01-21 MED ORDER — METOCLOPRAMIDE HCL 10 MG PO TABS: 10.0000 mg | ORAL_TABLET | Freq: Four times a day (QID) | ORAL | Status: DC | PRN

## 2012-01-21 MED ORDER — IOHEXOL 300 MG/ML  SOLN
20.0000 mL | INTRAMUSCULAR | Status: AC
Start: 2012-01-21 — End: 2012-01-21
  Administered 2012-01-21 (×2): 20 mL via ORAL

## 2012-01-21 MED ORDER — ONDANSETRON HCL 4 MG/2ML IJ SOLN
4.0000 mg | Freq: Once | INTRAMUSCULAR | Status: AC
  Administered 2012-01-21: 4 mg via INTRAVENOUS
  Filled 2012-01-21: qty 2

## 2012-01-21 MED ORDER — HYDROMORPHONE HCL PF 1 MG/ML IJ SOLN
1.0000 mg | Freq: Once | INTRAMUSCULAR | Status: AC
  Administered 2012-01-21: 1 mg via INTRAVENOUS
  Filled 2012-01-21: qty 1

## 2012-01-21 MED ORDER — OXYCODONE-ACETAMINOPHEN 5-325 MG PO TABS: 1.0000 | ORAL_TABLET | ORAL | Status: DC | PRN

## 2012-01-21 NOTE — ED Notes (Signed)
Abd pain x 2-3 days. Denies other s/s.

## 2012-01-21 NOTE — ED Provider Notes (Signed)
History   This chart was scribed for Dione Booze, MD by Toya Smothers. The patient was seen in room MH08/MH08. Patient's care was started at 1532.  CSN: [id number]  Arrival date & time 01/21/12  1532   First MD Initiated Contact with Patient 01/21/12 1707      Chief Complaint  Patient presents with  . Abdominal Pain   The history is provided by the patient. No language interpreter was used.   Cynthia Potter is a 32 y.o. female with a h/o caesarian section who presents to the ED complaining of 3 days of sudden onset severe constant epigastric pain with associate mild nausea and emesis. Pain is migrating inferiorly from the epigastric area, is aggravated during bowel movements, alleviated by nothing, and ranked 7/10. Pt denotes that abdominal pain "putts pressure on her lower back," but does not radiate. Denies constipation, diarrhea, vaginal discharge, blood in stool, and the use of birth control. She is a current everyday smoker and admits the use of alcohol.  History reviewed. No pertinent past medical history.  Past Surgical History  Procedure Date  . Cesarean section     History reviewed. No pertinent family history.  History  Substance Use Topics  . Smoking status: Current Every Day Smoker  . Smokeless tobacco: Not on file  . Alcohol Use: Yes   Review of Systems  Gastrointestinal: Positive for nausea, vomiting and abdominal pain. Negative for diarrhea, constipation and blood in stool.  Genitourinary: Negative for vaginal discharge and menstrual problem.  All other systems reviewed and are negative.    Allergies  Review of patient's allergies indicates no known allergies.  Home Medications  No current outpatient prescriptions on file.  BP 112/57  Pulse 79  Temp 98.7 F (37.1 C) (Oral)  Resp 16  Ht 5' 4.5" (1.638 m)  Wt 160 lb (72.576 kg)  BMI 27.04 kg/m2  SpO2 100%  LMP 01/21/2012  Physical Exam  Constitutional: She is oriented to person, place, and time. She  appears well-developed and well-nourished. No distress.  HENT:  Head: Normocephalic and atraumatic.  Mouth/Throat: Oropharynx is clear and moist. No oropharyngeal exudate.  Eyes: EOM are normal. Pupils are equal, round, and reactive to light. No scleral icterus.  Neck: Normal range of motion. Neck supple. No tracheal deviation present.  Cardiovascular: Normal rate, regular rhythm and normal heart sounds.   No murmur heard. Pulmonary/Chest: Effort normal and breath sounds normal. No respiratory distress.  Abdominal: Soft. Bowel sounds are normal. There is no rebound and no guarding.       Mild tenderness LLQ. Moderate tenderness RLQ. No suprapubic tenderness. Bowel sounds decreased.  Musculoskeletal: Normal range of motion. She exhibits no edema and no tenderness.  Lymphadenopathy:    She has no cervical adenopathy.  Neurological: She is alert and oriented to person, place, and time. Coordination normal.  Skin: Skin is warm and dry. No rash noted.  Psychiatric: She has a normal mood and affect. Her behavior is normal. Judgment and thought content normal.    ED Course  Procedures DIAGNOSTIC STUDIES: Oxygen Saturation is 100% on room air, noraml by my interpretation.    COORDINATION OF CARE: 16:01- Ordered Urinalysis, Routine w reflex microscopic Once and Pregnancy, urine STAT. 17:27- Evaluated Pt. Pt is awake, alert, and oriented. 17:31- Ordered CBC with Differential STAT, Comprehensive metabolic panel STAT, Lipase, blood STAT.  Results for orders placed during the hospital encounter of 01/21/12  URINALYSIS, ROUTINE W REFLEX MICROSCOPIC      Component  Value Range   Color, Urine YELLOW  YELLOW   APPearance CLOUDY (*) CLEAR   Specific Gravity, Urine 1.034 (*) 1.005 - 1.030   pH 6.0  5.0 - 8.0   Glucose, UA NEGATIVE  NEGATIVE mg/dL   Hgb urine dipstick NEGATIVE  NEGATIVE   Bilirubin Urine SMALL (*) NEGATIVE   Ketones, ur 15 (*) NEGATIVE mg/dL   Protein, ur NEGATIVE  NEGATIVE mg/dL     Urobilinogen, UA 1.0  0.0 - 1.0 mg/dL   Nitrite NEGATIVE  NEGATIVE   Leukocytes, UA NEGATIVE  NEGATIVE  PREGNANCY, URINE      Component Value Range   Preg Test, Ur NEGATIVE  NEGATIVE  CBC WITH DIFFERENTIAL      Component Value Range   WBC 7.9  4.0 - 10.5 K/uL   RBC 4.28  3.87 - 5.11 MIL/uL   Hemoglobin 13.4  12.0 - 15.0 g/dL   HCT 16.1  09.6 - 04.5 %   MCV 89.3  78.0 - 100.0 fL   MCH 31.3  26.0 - 34.0 pg   MCHC 35.1  30.0 - 36.0 g/dL   RDW 40.9  81.1 - 91.4 %   Platelets 211  150 - 400 K/uL   Neutrophils Relative 67  43 - 77 %   Neutro Abs 5.4  1.7 - 7.7 K/uL   Lymphocytes Relative 25  12 - 46 %   Lymphs Abs 2.0  0.7 - 4.0 K/uL   Monocytes Relative 6  3 - 12 %   Monocytes Absolute 0.5  0.1 - 1.0 K/uL   Eosinophils Relative 1  0 - 5 %   Eosinophils Absolute 0.1  0.0 - 0.7 K/uL   Basophils Relative 0  0 - 1 %   Basophils Absolute 0.0  0.0 - 0.1 K/uL  COMPREHENSIVE METABOLIC PANEL      Component Value Range   Sodium 140  135 - 145 mEq/L   Potassium 3.9  3.5 - 5.1 mEq/L   Chloride 105  96 - 112 mEq/L   CO2 27  19 - 32 mEq/L   Glucose, Bld 84  70 - 99 mg/dL   BUN 11  6 - 23 mg/dL   Creatinine, Ser 7.82  0.50 - 1.10 mg/dL   Calcium 9.5  8.4 - 95.6 mg/dL   Total Protein 7.0  6.0 - 8.3 g/dL   Albumin 3.8  3.5 - 5.2 g/dL   AST 12  0 - 37 U/L   ALT 9  0 - 35 U/L   Alkaline Phosphatase 66  39 - 117 U/L   Total Bilirubin 0.3  0.3 - 1.2 mg/dL   GFR calc non Af Amer >90  >90 mL/min   GFR calc Af Amer >90  >90 mL/min  LIPASE, BLOOD      Component Value Range   Lipase 31  11 - 59 U/L   Ct Abdomen Pelvis W Contrast  01/21/2012  *RADIOLOGY REPORT*  Clinical Data: Sudden onset of severe epigastric pain with associated nausea and vomiting.  Smoker.  Previous C-section.  CT ABDOMEN AND PELVIS WITH CONTRAST  Technique:  Multidetector CT imaging of the abdomen and pelvis was performed following the standard protocol during bolus administration of intravenous contrast.  Contrast: 1  OMNIPAQUE IOHEXOL 300 MG/ML  SOLN 100 ml  Comparison: None.  Findings: The oral contrast has not reached the distal small bowel or colon.  The opacified small bowel is normal in caliber.  Mildly prominent fluid-filled distal small bowel.  Normal caliber colon containing gas and stool.  No evidence of appendicitis.  Small amount of free peritoneal fluid in the pelvic cul-de-sac.  No free peritoneal air.  Oval area of soft tissue density anterior to the rectus muscles in the midline at the level of the mid to lower pelvis, measuring 2.0 x 1.2 cm in maximum dimensions on image number 70.  This measures this measures 2.4 cm in length on sagittal image number 55.  This is contiguous with the underlying rectus muscles.  Suggestion of an oval low density uterine fundal mass on the right, measuring 1.8 x 0.9 cm on image number 71.  Unremarkable ovaries.  Normal appearing liver, spleen, pancreas, gallbladder and adrenal glands.  No gastric abnormalities seen.  No enlarged lymph nodes.  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  Moderate left lateral spur formation at the T11-12 level with mild right lateral spur formation at the level as well as disc space narrowing and anterior spur formation.  IMPRESSION:  1.  Small amount of free peritoneal fluid in the pelvic cul-de-sac. 2.  2.4 x 2.0 x 1.2 cm mass-like density anterior to the rectus abdominus muscles inferiorly.  This is at the expected location of the the patient has C-section scar and is compatible with focal fibrosis or desmoid formation. 3.  Probable right fundal uterine fibroid.   Original Report Authenticated By: Darrol Angel, M.D.       1. Abdominal pain       MDM  Abdominal pain of uncertain cause. She is most tender over McBurney's area, so CT scan will be obtained to rule out appendicitis.  Workup has come back unremarkable. She checked pain relief from hydromorphone and good relief of nausea from  the ondansetron. She is sent home with prescriptions for Percocet and metoclopramide.    I personally performed the services described in this documentation, which was scribed in my presence. The recorded information has been reviewed and considered.      Dione Booze, MD 01/21/12 2019

## 2012-01-21 NOTE — ED Notes (Signed)
Pt ambulatory to [facility]

## 2015-01-15 ENCOUNTER — Emergency Department (HOSPITAL_COMMUNITY): Payer: Self-pay

## 2015-01-15 ENCOUNTER — Encounter (HOSPITAL_COMMUNITY): Payer: Self-pay | Admitting: Emergency Medicine

## 2015-01-15 ENCOUNTER — Emergency Department (HOSPITAL_COMMUNITY)
Admission: EM | Admit: 2015-01-15 | Discharge: 2015-01-16 | Disposition: A | Discharge status: Home / Self Care | Payer: Self-pay | Attending: Emergency Medicine | Admitting: Emergency Medicine

## 2015-01-15 DIAGNOSIS — Z720 Tobacco use: Secondary | ICD-10-CM | POA: Insufficient documentation

## 2015-01-15 DIAGNOSIS — R200 Anesthesia of skin: Secondary | ICD-10-CM | POA: Insufficient documentation

## 2015-01-15 DIAGNOSIS — M25551 Pain in right hip: Secondary | ICD-10-CM | POA: Insufficient documentation

## 2015-01-15 NOTE — ED Provider Notes (Signed)
CSN: [id number]     Arrival date & time 01/15/15  2044 History  By signing my name below, I, Cynthia Potter, attest that this documentation has been prepared under the direction and in the presence of Elpidio Anis, PA-C. Electronically Signed: Phillis Potter, ED Scribe. 01/15/2015. 10:35 PM.  Chief Complaint  Patient presents with  . Hip Pain   The history is provided by the patient. No language interpreter was used.  HPI Comments: REEVE MALLO is a 34 y.o. female who presents to the Emergency Department complaining of right hip pain onset 4 days ago. Pt states that she believes there is swelling. Reports hx of similar symptoms a few months ago. Pt states "it feels like I pulled a muscle type feeling." States she is able to ambulate, but reports worsening pain with walking and palpation. Denies back pain, abdominal pain, fall or injury to the area. Pt does not have a PCP.  History reviewed. No pertinent past medical history. Past Surgical History  Procedure Laterality Date  . Cesarean section     History reviewed. No pertinent family history. Social History  Substance Use Topics  . Smoking status: Current Every Day Smoker  . Smokeless tobacco: None  . Alcohol Use: Yes   OB History    No data available     Review of Systems  Gastrointestinal: Negative for abdominal pain.  Musculoskeletal: Positive for arthralgias. Negative for back pain.  Neurological: Positive for numbness.   Allergies  Review of patient's allergies indicates no known allergies.  Home Medications   Prior to Admission medications   Medication Sig Start Date End Date Taking? Authorizing Provider  metoCLOPramide (REGLAN) 10 MG tablet Take 1 tablet (10 mg total) by mouth every 6 (six) hours as needed (nausea). 01/21/12   Dione Booze, MD  oxyCODONE-acetaminophen (PERCOCET/ROXICET) 5-325 MG per tablet Take 1 tablet by mouth every 4 (four) hours as needed for pain. 01/21/12   Dione Booze, MD   BP 109/54 mmHg   Pulse 76  Temp(Src) 98 F (36.7 C) (Oral)  Resp 20  SpO2 100%  LMP 01/15/2015 (Exact Date)  Physical Exam  Constitutional: She is oriented to person, place, and time. She appears well-developed and well-nourished.  HENT:  Head: Normocephalic and atraumatic.  Eyes: EOM are normal.  Neck: Normal range of motion. Neck supple.  Cardiovascular: Normal rate.   Pulmonary/Chest: Effort normal.  Abdominal: Soft. There is no tenderness.  Musculoskeletal: Normal range of motion.  No lower back tenderness; full ROM with full strength of the RLE; reflexes in lower extremity are equal bilaterally; DP pulses intact; right hip without swelling, tender laterally  Neurological: She is alert and oriented to person, place, and time.  Skin: Skin is warm and dry.  Psychiatric: She has a normal mood and affect. Her behavior is normal.  Nursing note and vitals reviewed.   ED Course  Procedures (including critical care time) DIAGNOSTIC STUDIES: Oxygen Saturation is 100% on RA, normal by my interpretation.    COORDINATION OF CARE: 10:57 PM-Discussed treatment plan which includes x-ray with pt at bedside and pt agreed to plan.   Labs Review Labs Reviewed - No data to display  Imaging Review No results found.    EKG Interpretation None      MDM   Final diagnoses:  None    1. Right hip pain  No fracture or evidence of infection. She can be discharged home with PCP follow up .  I personally performed the services described in  this documentation, which was scribed in my presence. The recorded information has been reviewed and is accurate.     Elpidio Anis, PA-C 01/21/15 0018  Leta Baptist, MD 01/23/15 [phone]

## 2015-01-15 NOTE — ED Notes (Addendum)
Pt c/o right hip pain "like I pulled a muscle type feeling." Denies injury/trauma but says, "I've been running up and down ladders more recently for work." No other c/c. Patient ambulatory with a slight limp. Endorses numbness and "sensations" today when she was ambulating on leg at work.

## 2015-01-16 MED ORDER — NAPROXEN 500 MG PO TABS: 500.0000 mg | ORAL_TABLET | Freq: Two times a day (BID) | ORAL | Status: DC

## 2015-01-16 MED ORDER — HYDROCODONE-ACETAMINOPHEN 5-325 MG PO TABS: 1.0000 | ORAL_TABLET | ORAL | Status: AC | PRN

## 2015-01-16 NOTE — Discharge Instructions (Signed)
FOLLOW UP WITH DR. Roda Shutters FOR FURTHER MANAGEMENT OF RECURRENT HIP PAIN. TAKE MEDICATIONS AS PRESCRIBED. RETURN HERE AS NEEDED.

## 2016-06-21 ENCOUNTER — Encounter (HOSPITAL_COMMUNITY): Payer: Self-pay

## 2016-06-21 ENCOUNTER — Emergency Department (HOSPITAL_COMMUNITY)
Admission: EM | Admit: 2016-06-21 | Discharge: 2016-06-21 | Disposition: A | Discharge status: Home / Self Care | Payer: Managed Care, Other (non HMO) | Attending: Emergency Medicine | Admitting: Emergency Medicine

## 2016-06-21 DIAGNOSIS — F17200 Nicotine dependence, unspecified, uncomplicated: Secondary | ICD-10-CM | POA: Insufficient documentation

## 2016-06-21 DIAGNOSIS — L0501 Pilonidal cyst with abscess: Secondary | ICD-10-CM | POA: Diagnosis present

## 2016-06-21 LAB — POC URINE PREG, ED: PREG TEST UR: NEGATIVE

## 2016-06-21 MED ORDER — ACETAMINOPHEN 325 MG PO TABS
650.0000 mg | ORAL_TABLET | Freq: Once | ORAL | Status: AC
  Administered 2016-06-21: 650 mg via ORAL
  Filled 2016-06-21: qty 2

## 2016-06-21 MED ORDER — LIDOCAINE-EPINEPHRINE (PF) 2 %-1:200000 IJ SOLN
20.0000 mL | Freq: Once | INTRAMUSCULAR | Status: AC
  Administered 2016-06-21: 20 mL
  Filled 2016-06-21: qty 20

## 2016-06-21 MED ORDER — NAPROXEN 250 MG PO TABS: 250.0000 mg | ORAL_TABLET | Freq: Two times a day (BID) | ORAL | 0 refills | Status: AC

## 2016-06-21 MED ORDER — DOXYCYCLINE HYCLATE 100 MG PO CAPS: 100.0000 mg | ORAL_CAPSULE | Freq: Two times a day (BID) | ORAL | 0 refills | Status: AC

## 2016-06-21 NOTE — ED Provider Notes (Signed)
[facility]-EMERGENCY DEPT Provider Note   CSN: [id number] Arrival date & time: 06/21/16  1936  By signing my name below, I, Cynthia Potter, attest that this documentation has been prepared under the direction and in the presence of Cynthia FarrierWilliam Phala Schraeder, PA-C. Electronically Signed: Orpah CobbMaurice Potter , ED Scribe. 06/21/16. 10:39 PM.   History   Chief Complaint Chief Complaint  Patient presents with  . Abscess    HPI  Cynthia Potter is a 36 y.o. female who presents to the Emergency Department complaining of pilonidal abscess with gradual onset  x4 days. Pt states that she has a boil to the gluteal cleft that began producing pain x4 days ago. She denies any rectal pain or pain with bowel movements. Pt denies abdominal pain, vomiting, diarrhea, fever and chills. She denies any known allergies. Of note, pt's last menstrual was x1 month ago.   The history is provided by the patient and medical records. No language interpreter was used.    History reviewed. No pertinent past medical history.  There are no active problems to display for this patient.   Past Surgical History:  Procedure Laterality Date  . CESAREAN SECTION      OB History    No data available       Home Medications    Prior to Admission medications   Medication Sig Start Date End Date Taking? Authorizing Provider  doxycycline (VIBRAMYCIN) 100 MG capsule Take 1 capsule (100 mg total) by mouth 2 (two) times daily. 06/21/16   Cynthia FarrierWilliam Dalayna Lauter, PA-C  HYDROcodone-acetaminophen (NORCO/VICODIN) 5-325 MG tablet Take 1-2 tablets by mouth every 4 (four) hours as needed. 01/16/15   Elpidio AnisShari Upstill, PA-C  naproxen (NAPROSYN) 250 MG tablet Take 1 tablet (250 mg total) by mouth 2 (two) times daily with a meal. 06/21/16   Cynthia FarrierWilliam Remmington Teters, PA-C    Family History History reviewed. No pertinent family history.  Social History Social History  Substance Use Topics  . Smoking status: Current Every Day Smoker  . Smokeless tobacco: Never Used  .  Alcohol use Yes     Allergies   Patient has no known allergies.   Review of Systems Review of Systems  Constitutional: Negative for chills and fever.  Gastrointestinal: Negative for abdominal pain, diarrhea, nausea and vomiting.  Genitourinary: Negative for difficulty urinating and dysuria.  Musculoskeletal: Negative for back pain.  Skin: Positive for wound.       Pilonidal abscess     Physical Exam Updated Vital Signs BP 100/85 (BP Location: Left Arm)   Pulse 97   Temp 100.1 F (37.8 C) (Oral)   Resp 18   Ht 5\' 4"  (1.626 m)   Wt 66.7 kg   SpO2 100%   BMI 25.23 kg/m   Physical Exam  Constitutional: She appears well-developed and well-nourished. No distress.  Non toxic appearing.  HENT:  Head: Normocephalic and atraumatic.  Eyes: Right eye exhibits no discharge. Left eye exhibits no discharge.  Cardiovascular: Normal rate, regular rhythm and intact distal pulses.   Pulmonary/Chest: Effort normal. No respiratory distress.  Abdominal: Soft. There is no tenderness. There is no guarding.  Abdomen is soft and nontender to palpation.  Genitourinary:  Genitourinary Comments: Large pilonidal abscess to her gluteal cleft that is surrounded by induration. No discharge noted. No surrounding erythema. No extension into her rectum or near her anus.   Neurological: She is alert. Coordination normal.  Skin: Skin is warm and dry. Capillary refill takes less than 2 seconds. No rash noted. She is  not diaphoretic. No pallor.  Pilonidal abscess with surrounding induration with no discharge.   Psychiatric: She has a normal mood and affect. Her behavior is normal.  Nursing note and vitals reviewed.    ED Treatments / Results   DIAGNOSTIC STUDIES: Oxygen Saturation is 100% on RA, normal by my interpretation.   COORDINATION OF CARE: 10:39 PM-Discussed next steps with pt. Pt verbalized understanding and is agreeable with the plan.   Labs (all labs ordered are listed, but only  abnormal results are displayed) Labs Reviewed  POC URINE PREG, ED    EKG  EKG Interpretation None       Radiology No results found.  Procedures .Marland KitchenIncision and Drainage Date/Time: 06/21/2016 9:48 PM Performed by: Cynthia Potter Authorized by: Cynthia Potter   Consent:    Consent obtained:  Verbal   Consent given by:  Patient   Risks discussed:  Bleeding, pain, incomplete drainage, infection and damage to other organs   Alternatives discussed:  Delayed treatment and alternative treatment Location:    Type:  Pilonidal cyst   Size:  Large    Location:  Anogenital   Anogenital location:  Gluteal cleft Pre-procedure details:    Skin preparation:  Betadine Anesthesia (see MAR for exact dosages):    Anesthesia method:  Local infiltration   Local anesthetic:  Lidocaine 2% WITH epi Procedure type:    Complexity:  Complex Procedure details:    Needle aspiration: no     Incision types:  Single straight   Incision depth:  Dermal   Scalpel blade:  11   Wound management:  Probed and deloculated, irrigated with saline and extensive cleaning   Drainage:  Purulent   Drainage amount:  Copious   Wound treatment:  Wound left open   Packing materials:  None Post-procedure details:    Patient tolerance of procedure:  Tolerated well, no immediate complications   (including critical care time)  Medications Ordered in ED Medications  lidocaine-EPINEPHrine (XYLOCAINE W/EPI) 2 %-1:200000 (PF) injection 20 mL (20 mLs Infiltration Given 06/21/16 2145)  acetaminophen (TYLENOL) tablet 650 mg (650 mg Oral Given 06/21/16 2220)     Initial Impression / Assessment and Plan / ED Course  I have reviewed the triage vital signs and the nursing notes.  Pertinent labs & imaging results that were available during my care of the patient were reviewed by me and considered in my medical decision making (see chart for details).    This is a 36 y.o. female who presents to the Emergency Department  complaining of pilonidal abscess with gradual onset  x4 days. Pt states that she has a boil to the gluteal cleft that began producing pain x4 days ago. She denies any rectal pain or pain with bowel movements. Pt denies abdominal pain, vomiting, diarrhea, fever and chills. On exam the patient is nontoxic appearing. She does have a temperature 100.6 on arrival. She denies any fevers. Again she is nontoxic-appearing. She been having normal bowel movements. She has a large pilonidal abscess. It does not extend near anus or rectum. No surrounding erythema. This was drained by myself and tolerated well by the patient. The copious amount of purulent drainage was obtained. Patient tolerated the procedure well. She is not pregnant. Will start on doxycycline and Naprosyn for pain control. I encouraged sitz baths. I discussed wound care instructions. I discussed strict and specific return precautions. I advised the patient to follow-up with their primary care provider this week. I advised the patient to return to the  emergency department with new or worsening symptoms or new concerns. The patient verbalized understanding and agreement with plan.      Final Clinical Impressions(s) / ED Diagnoses   Final diagnoses:  Pilonidal abscess    New Prescriptions New Prescriptions   DOXYCYCLINE (VIBRAMYCIN) 100 MG CAPSULE    Take 1 capsule (100 mg total) by mouth 2 (two) times daily.   NAPROXEN (NAPROSYN) 250 MG TABLET    Take 1 tablet (250 mg total) by mouth 2 (two) times daily with a meal.  I personally performed the services described in this documentation, which was scribed in my presence. The recorded information has been reviewed and is accurate.       Cynthia Farrier, PA-C 06/21/16 2240    Alvira Monday, MD 06/22/16 1406

## 2016-06-21 NOTE — ED Triage Notes (Signed)
States boil on coccyx area since Friday and now getting much worse no fever or n/v voiced.

## 2018-11-19 ENCOUNTER — Other Ambulatory Visit: Payer: Self-pay | Admitting: Obstetrics and Gynecology

## 2018-11-19 DIAGNOSIS — N6002 Solitary cyst of left breast: Secondary | ICD-10-CM

## 2018-11-27 ENCOUNTER — Ambulatory Visit
Admission: RE | Admit: 2018-11-27 | Discharge: 2018-11-27 | Disposition: A | Discharge status: Home / Self Care | Payer: Managed Care, Other (non HMO) | Source: Ambulatory Visit | Attending: Obstetrics and Gynecology | Admitting: Obstetrics and Gynecology

## 2018-11-27 ENCOUNTER — Other Ambulatory Visit: Payer: Self-pay

## 2018-11-27 DIAGNOSIS — N6002 Solitary cyst of left breast: Secondary | ICD-10-CM

## 2019-02-09 ENCOUNTER — Other Ambulatory Visit: Payer: Self-pay

## 2019-02-09 ENCOUNTER — Encounter (HOSPITAL_COMMUNITY): Payer: Self-pay | Admitting: Emergency Medicine

## 2019-02-09 ENCOUNTER — Emergency Department (HOSPITAL_COMMUNITY)
Admission: EM | Admit: 2019-02-09 | Discharge: 2019-02-09 | Disposition: A | Discharge status: Home / Self Care | Payer: 59 | Attending: Emergency Medicine | Admitting: Emergency Medicine

## 2019-02-09 DIAGNOSIS — M5441 Lumbago with sciatica, right side: Secondary | ICD-10-CM | POA: Diagnosis not present

## 2019-02-09 DIAGNOSIS — M545 Low back pain: Secondary | ICD-10-CM | POA: Diagnosis present

## 2019-02-09 DIAGNOSIS — F17210 Nicotine dependence, cigarettes, uncomplicated: Secondary | ICD-10-CM | POA: Diagnosis not present

## 2019-02-09 DIAGNOSIS — Z79899 Other long term (current) drug therapy: Secondary | ICD-10-CM | POA: Insufficient documentation

## 2019-02-09 MED ORDER — METHOCARBAMOL 500 MG PO TABS: 500.0000 mg | ORAL_TABLET | Freq: Every evening | ORAL | 0 refills | Status: AC | PRN

## 2019-02-09 MED ORDER — LIDOCAINE 5 % EX PTCH
1.0000 | MEDICATED_PATCH | CUTANEOUS | Status: DC
  Administered 2019-02-09: 1 via TRANSDERMAL
  Filled 2019-02-09: qty 1

## 2019-02-09 MED ORDER — PREDNISONE 10 MG (21) PO TBPK: ORAL_TABLET | Freq: Every day | ORAL | 0 refills | Status: AC

## 2019-02-09 MED ORDER — METHOCARBAMOL 500 MG PO TABS
1000.0000 mg | ORAL_TABLET | Freq: Once | ORAL | Status: AC
  Administered 2019-02-09: 1000 mg via ORAL
  Filled 2019-02-09: qty 2

## 2019-02-09 MED ORDER — KETOROLAC TROMETHAMINE 15 MG/ML IJ SOLN
15.0000 mg | Freq: Once | INTRAMUSCULAR | Status: AC
  Administered 2019-02-09: 15 mg via INTRAMUSCULAR
  Filled 2019-02-09: qty 1

## 2019-02-09 NOTE — ED Triage Notes (Signed)
C/o R lower back pain that radiates down R leg x 1 week.  No known injury.  Denies urinary complaints.  Pain worse with movement.

## 2019-02-09 NOTE — ED Provider Notes (Signed)
[facility]PART[facility] Provider Note   CSN: [id number] Arrival date & time: 02/09/19  1058     History   Chief Complaint Chief Complaint  Patient presents with  . Back Pain    HPI Cynthia Potter is a 38 y.o. female presenting for evaluation of back pain.  Patient states she has frequently had problems with her back.  Last week, she had worsening right-sided back pain which radiates down her right leg.  There is no change in activity or obvious precipitating event.  No fall, trauma, or injury.  Pain improved when she is taking ibuprofen and stretching, however she did not do this yesterday.  Today when she woke up her pain was worse again, thus prompting her ER visit.  Today her pain is been constant in her right lower back, radiating down her right leg.  Worse when she changes position.  She has not taken anything for it today.  She denies fevers, chills, nausea, vomiting, dumping, urinary symptoms, loss of bowel bladder control, numbness, tingling.  She has no other medical problems other than back pain, takes no medicines daily.     HPI  History reviewed. No pertinent past medical history.  There are no active problems to display for this patient.   Past Surgical History:  Procedure Laterality Date  . CESAREAN SECTION       OB History   No obstetric history on file.      Home Medications    Prior to Admission medications   Medication Sig Start Date End Date Taking? Authorizing Provider  doxycycline (VIBRAMYCIN) 100 MG capsule Take 1 capsule (100 mg total) by mouth 2 (two) times daily. 06/21/16   Waynetta Pean, PA-C  HYDROcodone-acetaminophen (NORCO/VICODIN) 5-325 MG tablet Take 1-2 tablets by mouth every 4 (four) hours as needed. 01/16/15   Charlann Lange, PA-C  methocarbamol (ROBAXIN) 500 MG tablet Take 1 tablet (500 mg total) by mouth at bedtime as needed for muscle spasms. 02/09/19   Ellis Koffler, PA-C  naproxen (NAPROSYN) 250 MG  tablet Take 1 tablet (250 mg total) by mouth 2 (two) times daily with a meal. 06/21/16   Waynetta Pean, PA-C  predniSONE (STERAPRED UNI-PAK 21 TAB) 10 MG (21) TBPK tablet Take by mouth daily. Take 6 tabs by mouth daily  for 2 days, then 5 tabs for 2 days, then 4 tabs for 2 days, then 3 tabs for 2 days, 2 tabs for 2 days, then 1 tab by mouth daily for 2 days 02/09/19   Amiel Mccaffrey, PA-C    Family History No family history on file.  Social History Social History   Tobacco Use  . Smoking status: Current Every Day Smoker  . Smokeless tobacco: Never Used  Substance Use Topics  . Alcohol use: Yes  . Drug use: No     Allergies   Patient has no known allergies.   Review of Systems Review of Systems  Musculoskeletal: Positive for back pain.  Neurological: Negative for numbness.     Physical Exam Updated Vital Signs BP (!) 110/53   Pulse 72   Temp 98.6 F (37 C) (Oral)   Resp 16   LMP 01/26/2019   SpO2 98%   Physical Exam Vitals signs and nursing note reviewed.  Constitutional:      General: She is not in acute distress.    Appearance: She is well-developed.     Comments: Appears uncomfortable due to pain, nontoxic in appearance  HENT:  Head: Normocephalic and atraumatic.  Neck:     Musculoskeletal: Normal range of motion and neck supple.  Cardiovascular:     Rate and Rhythm: Normal rate and regular rhythm.     Pulses: Normal pulses.  Pulmonary:     Effort: Pulmonary effort is normal.     Breath sounds: Normal breath sounds.  Abdominal:     General: There is no distension.     Palpations: There is no mass.     Tenderness: There is no abdominal tenderness. There is no guarding or rebound.     Comments: No abdominal tenderness  Musculoskeletal: Normal range of motion.     Comments: Palpation of right low back musculature and hip.  No pain over midline spine.  Increased pain with straight leg raise. Pedal pulses intact.  Good distal cap refill and sensation.   No saddle paresthesias.  Patellar reflexes intact bilaterally.  Skin:    General: Skin is warm.     Capillary Refill: Capillary refill takes less than 2 seconds.     Findings: No rash.  Neurological:     Mental Status: She is alert and oriented to person, place, and time.      ED Treatments / Results  Labs (all labs ordered are listed, but only abnormal results are displayed) Labs Reviewed - No data to display  EKG None  Radiology No results found.  Procedures Procedures (including critical care time)  Medications Ordered in ED Medications  lidocaine (LIDODERM) 5 % 1 patch (1 patch Transdermal Patch Applied 02/09/19 1216)  ketorolac (TORADOL) 15 MG/ML injection 15 mg (15 mg Intramuscular Given 02/09/19 1215)  methocarbamol (ROBAXIN) tablet 1,000 mg (1,000 mg Oral Given 02/09/19 1216)     Initial Impression / Assessment and Plan / ED Course  I have reviewed the triage vital signs and the nursing notes.  Pertinent labs & imaging results that were available during my care of the patient were reviewed by me and considered in my medical decision making (see chart for details).        Patient presenting for evaluation of low back pain.  Physical exam reassuring, neurovascularly intact.  No red flags for back pain.  Pain is reproducible with palpation of the musculature.  Likely sciatica.  Doubt fracture, I do not believe x-rays will be beneficial.  Doubt vertebral injury, infection, spinal cord compression, myelopathy, or cauda equina syndrome.  Will treat symptomatically with steroid taper, muscle relaxers, muscle creams.  Patient to follow-up with primary care as needed.  At this time, patient appears safe for discharge.  Return precautions given.  Patient states she understands agrees plan.  Final Clinical Impressions(s) / ED Diagnoses   Final diagnoses:  Acute right-sided low back pain with right-sided sciatica    ED Discharge Orders         Ordered    methocarbamol  (ROBAXIN) 500 MG tablet  At bedtime PRN     02/09/19 1206    predniSONE (STERAPRED UNI-PAK 21 TAB) 10 MG (21) TBPK tablet  Daily     02/09/19 1206           Alveria Apley, PA-C 02/09/19 1803    Sabas Sous, MD 02/10/19 [phone]

## 2019-02-09 NOTE — Discharge Instructions (Addendum)
Take prednisone as prescribed.  Do not take other anti-inflammatories at the same time (Advil, Motrin, naproxen, ibuprofen, Aleve). You may supplement with Tylenol if you need further pain control. Use Robaxin as needed for muscle stiffness or soreness. Have caution, as this may make you tired or groggy. Do not drive or operate heavy machinery while taking this medication.  Use muscle creams (bengay, icy hot, salonpas) as needed for pain.  Follow up with your primary care doctor if pain is not improving with this treatment.  Return to the ER if you develop high fevers, numbness, loss of bowel or bladder control, or any new or concerning symptoms.

## 2019-04-29 ENCOUNTER — Other Ambulatory Visit: Payer: 59

## 2019-05-01 ENCOUNTER — Other Ambulatory Visit: Payer: No Typology Code available for payment source

## 2019-05-01 ENCOUNTER — Ambulatory Visit: Payer: No Typology Code available for payment source | Attending: Internal Medicine

## 2019-05-01 DIAGNOSIS — Z20822 Contact with and (suspected) exposure to covid-19: Secondary | ICD-10-CM

## 2019-05-02 LAB — NOVEL CORONAVIRUS, NAA: SARS-CoV-2, NAA: NOT DETECTED

## 2021-08-07 IMAGING — US ULTRASOUND LEFT BREAST LIMITED
1 series · 11 of 11 positions shown · non-contrast
Comparison: Previous exam(s).

CLINICAL DATA: Patient presents for diagnostic evaluation of a
possible mass in the upper outer left breast.

EXAM:
DIGITAL DIAGNOSTIC LEFT MAMMOGRAM WITH TOMO
ULTRASOUND LEFT BREAST

[Series 1: ultrasound left breast limited · 0.07mm/px · 11 of 11 slices shown]
[im 1/11]
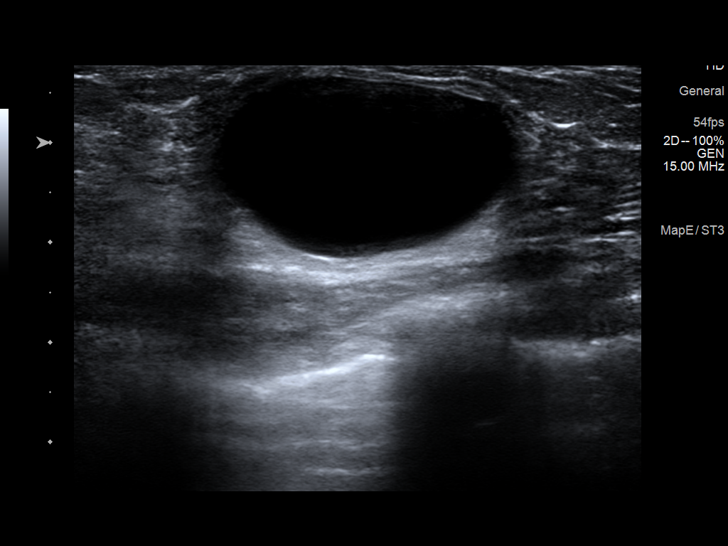
[im 2/11]
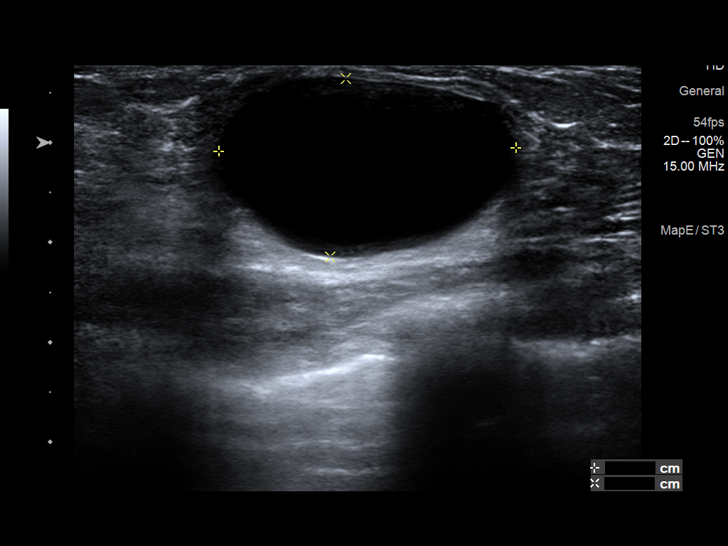
[im 3/11]
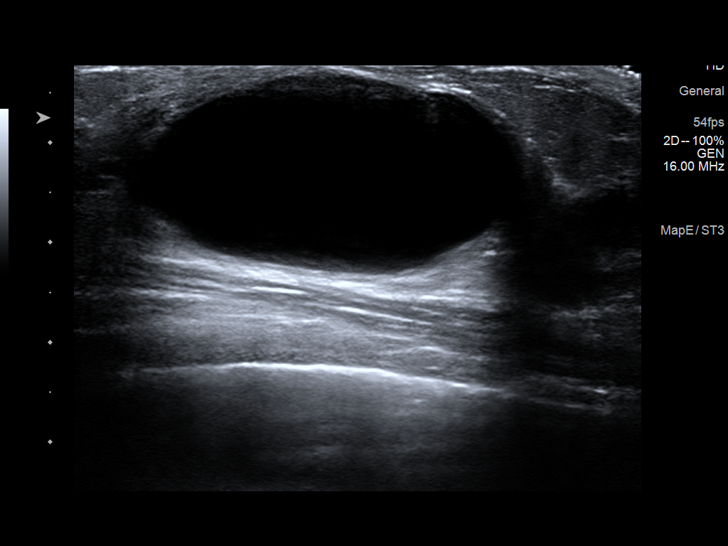
[im 4/11]
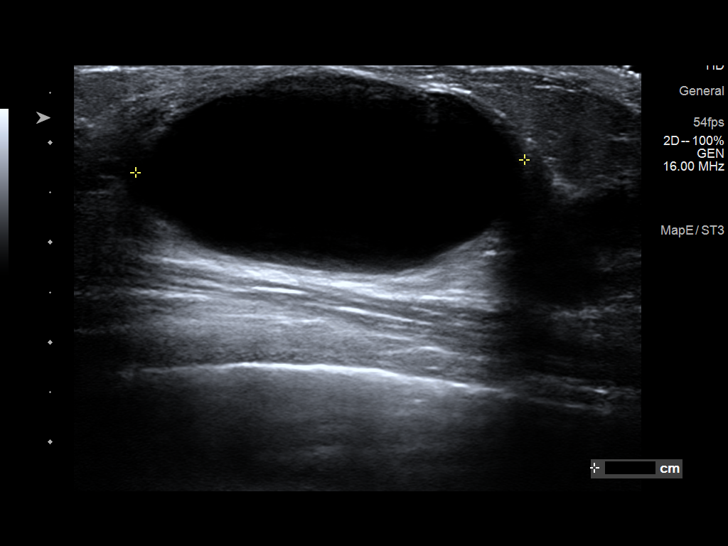
[im 5/11]
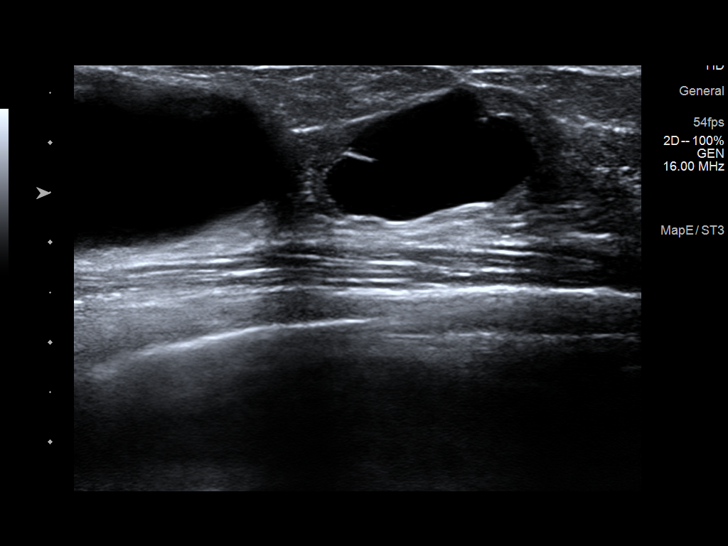
[im 6/11]
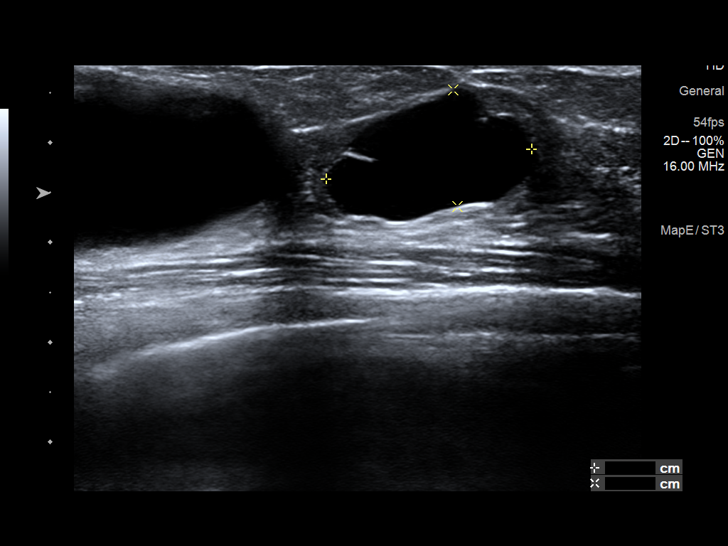
[im 7/11]
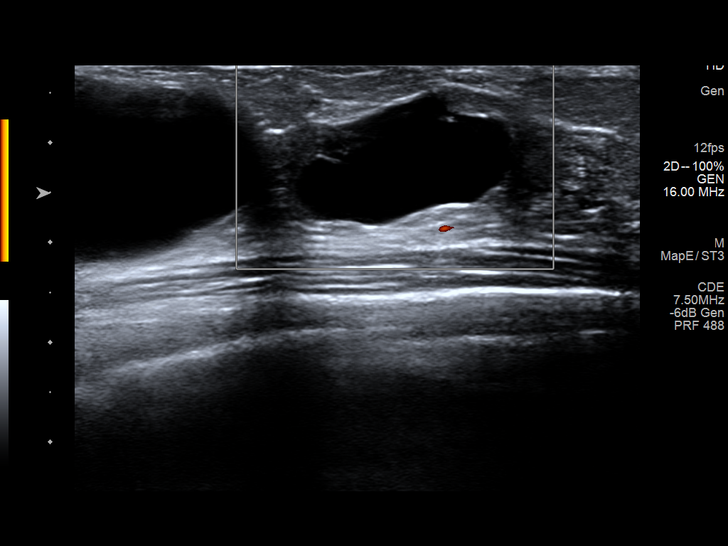
[im 8/11]
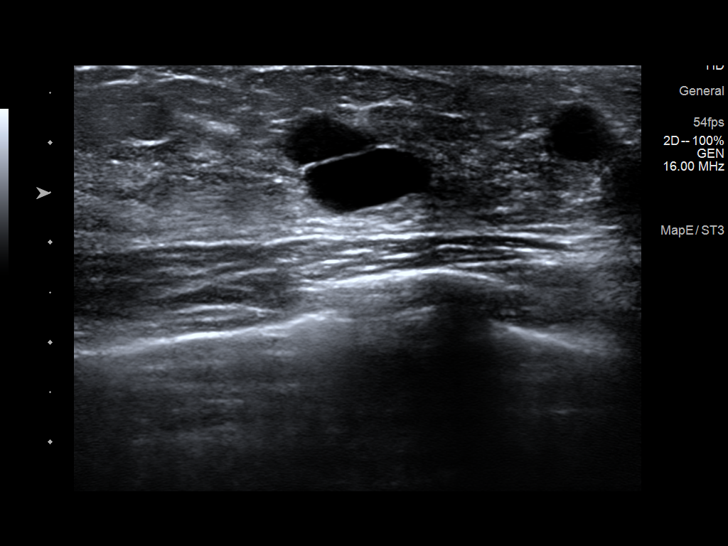
[im 9/11]
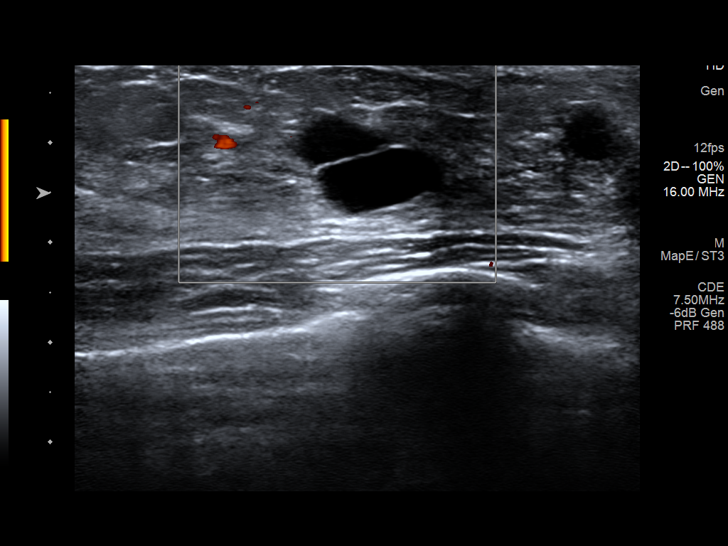
[im 10/11]
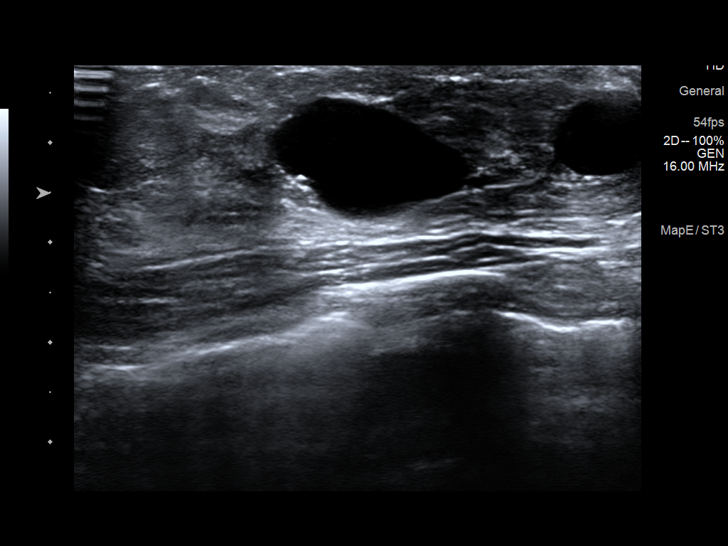
[im 11/11]
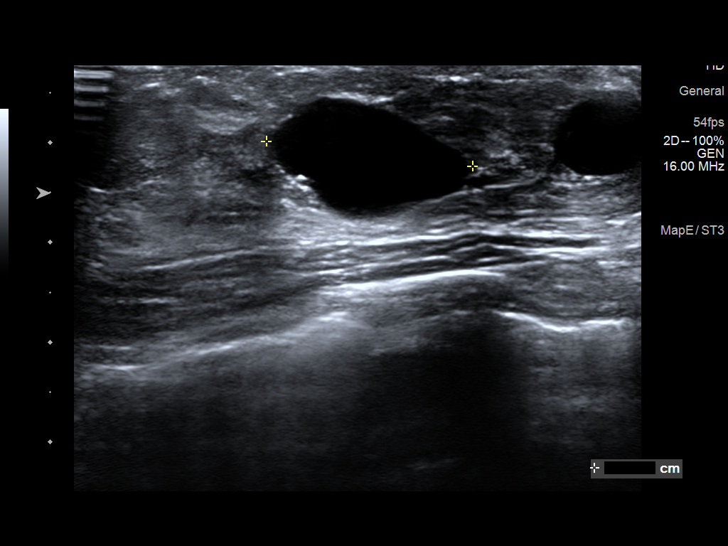

[11 of 11 positions shown; findings below may reference images not displayed]

ACR Breast Density Category d: The breast tissue is extremely dense,
which lowers the sensitivity of mammography.
FINDINGS: Mammogram: Spot compression tomosynthesis views were performed for
the possible mass in the upper outer quadrant left breast. There is
a large partially obscured mass measuring approximately 39 mm. There
is suggestion of an adjacent smaller mass.

Targeted ultrasound is performed, showing an oval anechoic
circumscribed mass at 1 o'clock 2 cm from the nipple measuring 39 x
18 x 30 mm. This demonstrates posterior enhancement. There is an
adjacent similar appearing smaller mass at the 1 o'clock
retroareolar position measuring 21 x 12 x 21 mm. These correspond to
the mammographic findings.
IMPRESSION: Left breast simple cysts at 1-2 o'clock are benign.

RECOMMENDATION:
Screening mammogram is recommended starting at age 40. The patient
was counseled that should the cysts become uncomfortable or painful,
aspiration could be performed.

I have discussed the findings and recommendations with the patient.
Results were also provided in writing at the conclusion of the
visit. If applicable, a reminder letter will be sent to the patient
regarding the next appointment.

BI-RADS CATEGORY  2: Benign.

## 2021-08-07 IMAGING — MG DIGITAL DIAGNOSTIC UNILATERAL LEFT MAMMOGRAM WITH TOMO AND CAD
4 series · 4 of 12 positions shown · non-contrast
Comparison: Previous exam(s).

CLINICAL DATA: Patient presents for diagnostic evaluation of a
possible mass in the upper outer left breast.

EXAM:
DIGITAL DIAGNOSTIC LEFT MAMMOGRAM WITH TOMO
ULTRASOUND LEFT BREAST

[L CC synth-2D]
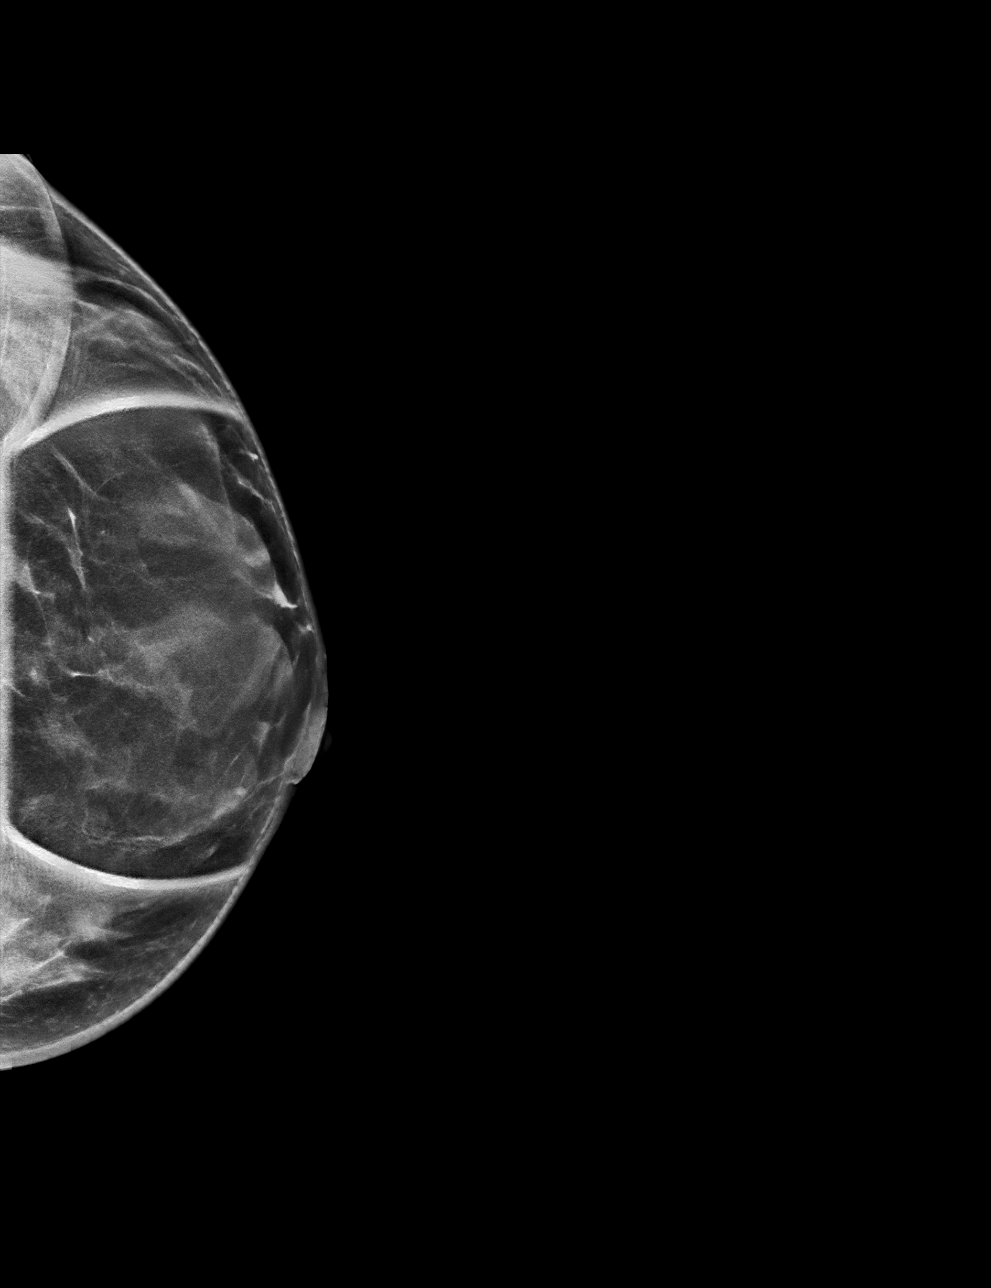

[L MLO synth-2D]
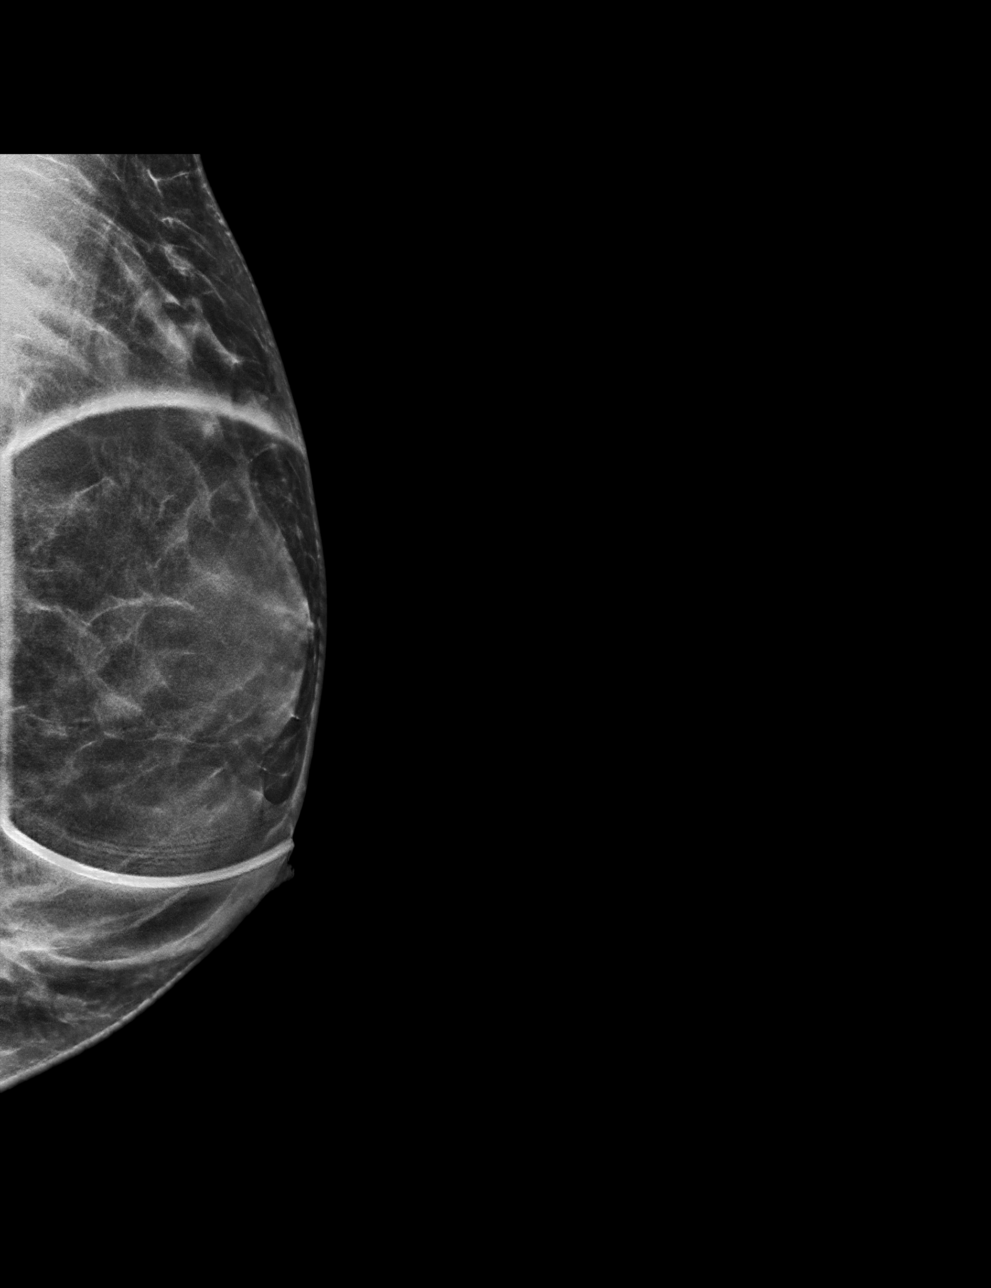

[L CC tomo · tomo slice 28/55.0]
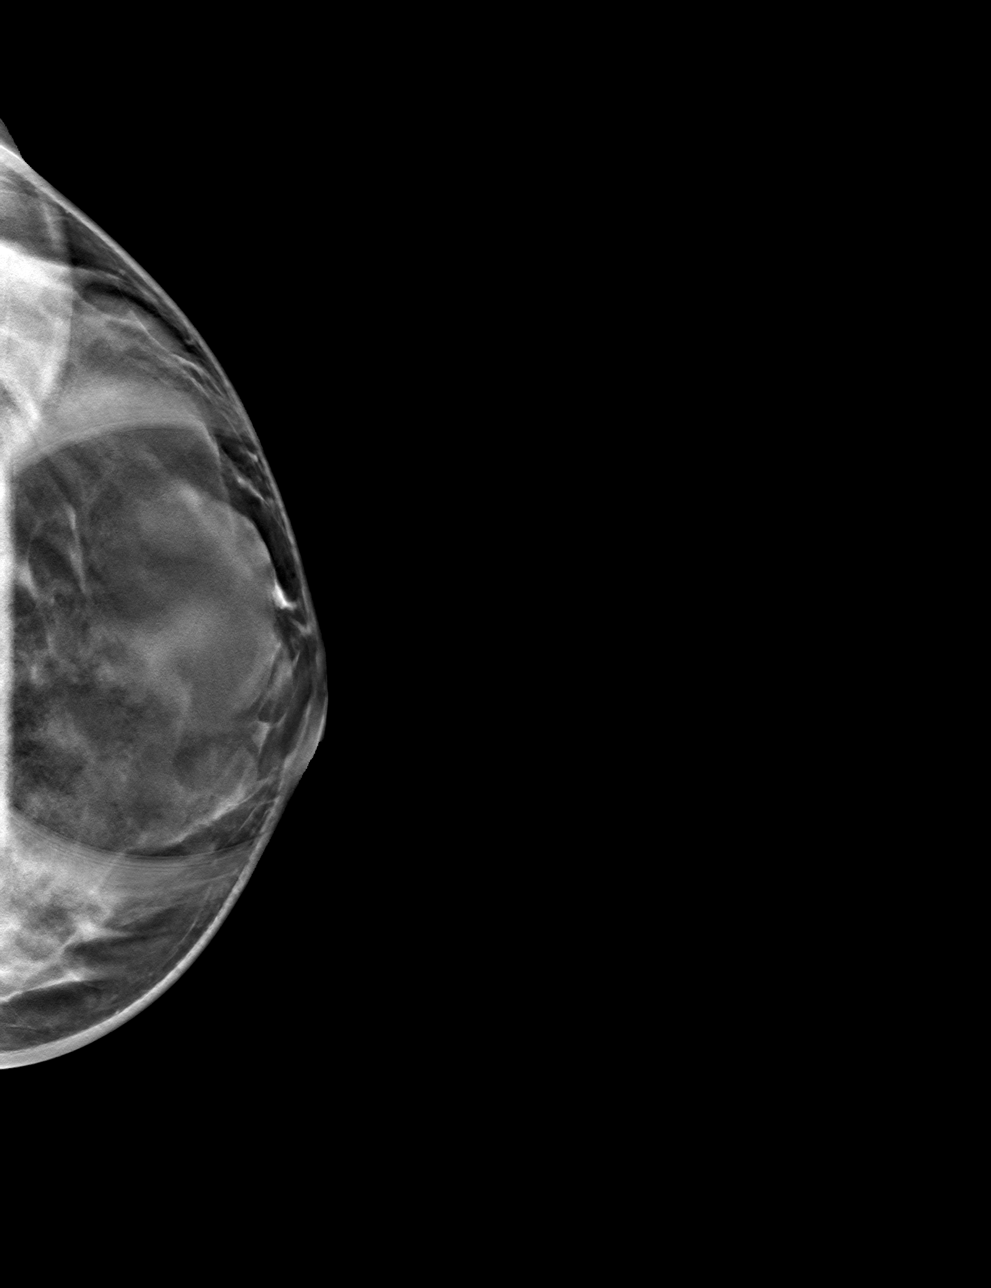

[L MLO tomo · tomo slice 27/53.0]
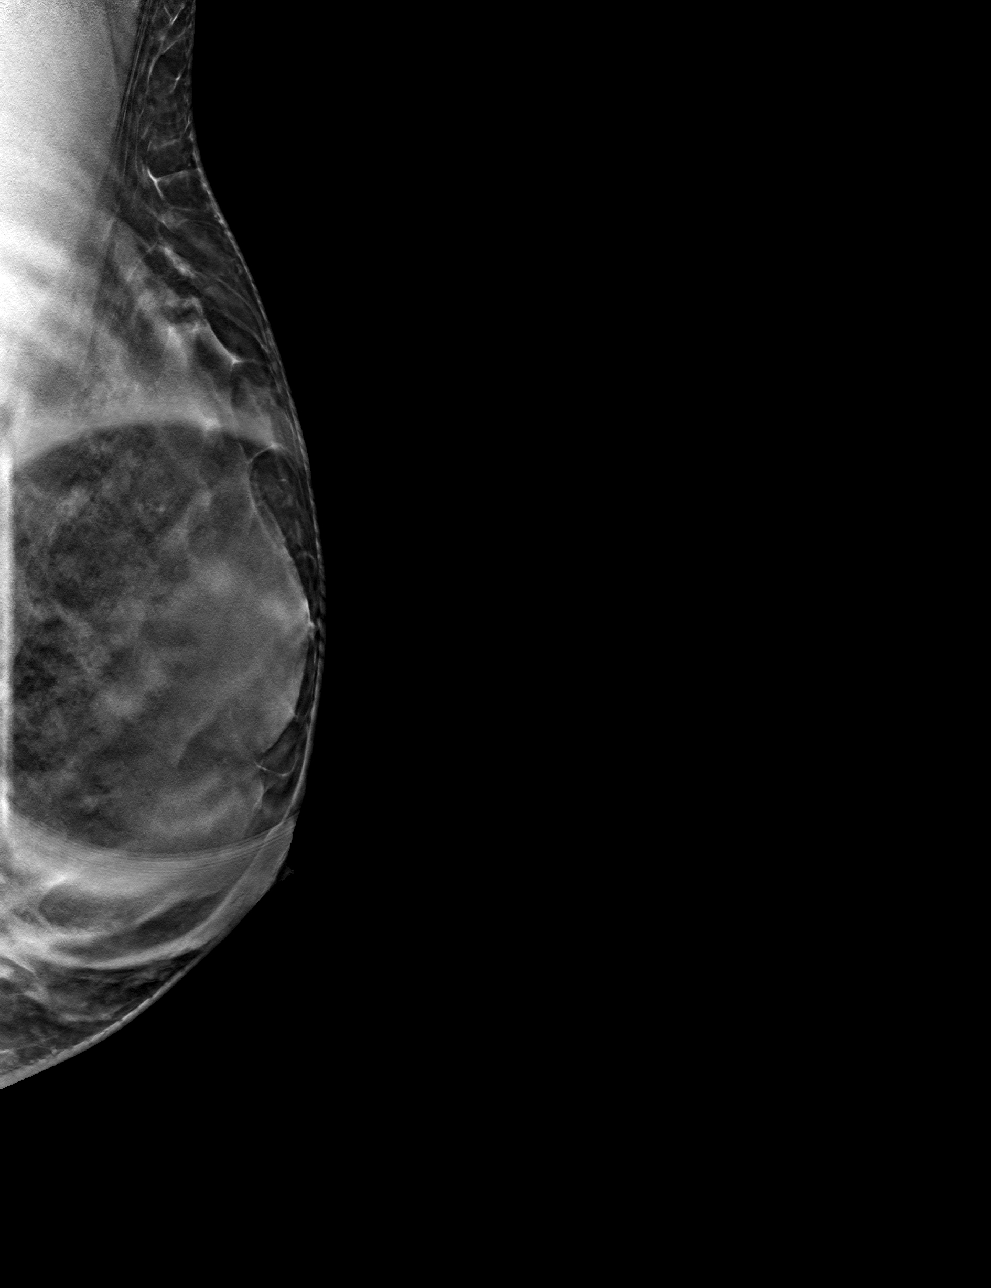

[4 of 12 positions shown; findings below may reference images not displayed]

ACR Breast Density Category d: The breast tissue is extremely dense,
which lowers the sensitivity of mammography.
FINDINGS: Mammogram: Spot compression tomosynthesis views were performed for
the possible mass in the upper outer quadrant left breast. There is
a large partially obscured mass measuring approximately 39 mm. There
is suggestion of an adjacent smaller mass.

Targeted ultrasound is performed, showing an oval anechoic
circumscribed mass at 1 o'clock 2 cm from the nipple measuring 39 x
18 x 30 mm. This demonstrates posterior enhancement. There is an
adjacent similar appearing smaller mass at the 1 o'clock
retroareolar position measuring 21 x 12 x 21 mm. These correspond to
the mammographic findings.
IMPRESSION: Left breast simple cysts at 1-2 o'clock are benign.

RECOMMENDATION:
Screening mammogram is recommended starting at age 40. The patient
was counseled that should the cysts become uncomfortable or painful,
aspiration could be performed.

I have discussed the findings and recommendations with the patient.
Results were also provided in writing at the conclusion of the
visit. If applicable, a reminder letter will be sent to the patient
regarding the next appointment.

BI-RADS CATEGORY  2: Benign.
